# Patient Record
Sex: Female | Born: 1995 | Hispanic: Yes | Marital: Single | Smoking: Never smoker
Health system: Southern US, Community
[De-identification: ages and names within clinical notes are randomized; demographics above are authoritative.]

## PROBLEM LIST (undated history)

## (undated) ENCOUNTER — Emergency Department (HOSPITAL_COMMUNITY): Admission: EM | Payer: MEDICAID | Source: Home / Self Care

## (undated) DIAGNOSIS — N289 Disorder of kidney and ureter, unspecified: Secondary | ICD-10-CM

---
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
## 2014-03-17 ENCOUNTER — Encounter (HOSPITAL_COMMUNITY): Payer: Self-pay | Admitting: Emergency Medicine

## 2014-03-17 ENCOUNTER — Emergency Department (HOSPITAL_COMMUNITY)
Admission: EM | Admit: 2014-03-17 | Discharge: 2014-03-18 | Disposition: A | Discharge status: Home / Self Care | Payer: Medicaid Other | Attending: Emergency Medicine | Admitting: Emergency Medicine

## 2014-03-17 ENCOUNTER — Emergency Department (HOSPITAL_COMMUNITY): Payer: Medicaid Other

## 2014-03-17 DIAGNOSIS — Z3202 Encounter for pregnancy test, result negative: Secondary | ICD-10-CM | POA: Insufficient documentation

## 2014-03-17 DIAGNOSIS — N1330 Unspecified hydronephrosis: Secondary | ICD-10-CM | POA: Insufficient documentation

## 2014-03-17 HISTORY — DX: Disorder of kidney and ureter, unspecified: N28.9

## 2014-03-17 LAB — URINALYSIS, ROUTINE W REFLEX MICROSCOPIC
BILIRUBIN URINE: NEGATIVE
GLUCOSE, UA: NEGATIVE mg/dL
HGB URINE DIPSTICK: NEGATIVE
Ketones, ur: NEGATIVE mg/dL
Leukocytes, UA: NEGATIVE
NITRITE: NEGATIVE
PH: 6 (ref 5.0–8.0)
Protein, ur: NEGATIVE mg/dL
SPECIFIC GRAVITY, URINE: 1.018 (ref 1.005–1.030)
Urobilinogen, UA: 1 mg/dL (ref 0.0–1.0)

## 2014-03-17 LAB — COMPREHENSIVE METABOLIC PANEL
ALBUMIN: 4.3 g/dL (ref 3.5–5.2)
ALK PHOS: 97 U/L (ref 39–117)
ALT: 11 U/L (ref 0–35)
AST: 14 U/L (ref 0–37)
BUN: 8 mg/dL (ref 6–23)
CALCIUM: 9.5 mg/dL (ref 8.4–10.5)
CO2: 25 mEq/L (ref 19–32)
Chloride: 105 mEq/L (ref 96–112)
Creatinine, Ser: 0.65 mg/dL (ref 0.50–1.10)
GFR calc Af Amer: >90 mL/min (ref >90)
GFR calc non Af Amer: >90 mL/min (ref >90)
Glucose, Bld: 100 mg/dL — ABNORMAL HIGH (ref 70–99)
POTASSIUM: 4.3 meq/L (ref 3.7–5.3)
SODIUM: 142 meq/L (ref 137–147)
TOTAL PROTEIN: 7.5 g/dL (ref 6.0–8.3)
Total Bilirubin: 0.3 mg/dL (ref 0.3–1.2)

## 2014-03-17 LAB — CBC
HCT: 39.1 % (ref 36.0–46.0)
Hemoglobin: 13.9 g/dL (ref 12.0–15.0)
MCH: 31.4 pg (ref 26.0–34.0)
MCHC: 35.5 g/dL (ref 30.0–36.0)
MCV: 88.3 fL (ref 78.0–100.0)
PLATELETS: 234 10*3/uL (ref 150–400)
RBC: 4.43 MIL/uL (ref 3.87–5.11)
RDW: 14 % (ref 11.5–15.5)
WBC: 10.8 10*3/uL — ABNORMAL HIGH (ref 4.0–10.5)

## 2014-03-17 LAB — POC URINE PREG, ED: PREG TEST UR: NEGATIVE

## 2014-03-17 MED ORDER — IBUPROFEN 400 MG PO TABS
600.0000 mg | ORAL_TABLET | Freq: Once | ORAL | Status: AC
  Administered 2014-03-17: 600 mg via ORAL
  Filled 2014-03-17 (×2): qty 1

## 2014-03-17 NOTE — ED Notes (Signed)
Pt with hx of hydronephrosis to ED c/o acute onset L flank pain while sitting in class.  Pain brought on wave of dizziness.  Pt also c/o decreased urination x 1 week.

## 2014-03-18 MED ORDER — HYDROCODONE-ACETAMINOPHEN 5-325 MG PO TABS: 1.0000 | ORAL_TABLET | Freq: Four times a day (QID) | ORAL | Status: DC | PRN

## 2014-03-18 MED ORDER — IBUPROFEN 400 MG PO TABS: 400.0000 mg | ORAL_TABLET | Freq: Four times a day (QID) | ORAL | Status: DC | PRN

## 2014-03-18 NOTE — Discharge Instructions (Signed)
Please call and make an appointment with [facility] - or [facility] ([facility] You have a mild hydronephrosis, which is unchanged from before. Take meds as prescribed.  Return to the ER if there pain is unbearable.   Hydronephrosis Hydronephrosis is an abnormal enlargement of your kidney. It can affect one or both the kidneys. It results from the backward pressure of urine on the kidneys, when the flow of urine is blocked. Normally, the urine drains from the kidney through the urine tube (ureter), into a sac which holds the urine until urination (bladder). When the urinary flow is blocked, the urine collects above the block. This causes an increase in the pressure inside the kidney, which in turn leads to its enlargement. The block can occur at the point where the kidney joins the ureter. Treatment depends on the cause and location of the block.  CAUSES  The causes of this condition include:  Birth defect of the kidney or ureter.  Kink at the point where the kidney joins the ureter.  Stones and blood clots in the kidney or ureter.  Cancer, injury, or infection of the ureter.  Scar tissue formation.  Backflow of urine (reflux).  Cancer of bladder or prostate gland.  Abnormality of the nerves or muscles of the kidney or ureter.  Lower part of the ureter protruding into the bladder (ureterocele).  Abnormal contractions of the bladder.  Both the kidneys can be affected during pregnancy. This is because the enlarging uterus presses on the ureters and blocks the flow of urine. SYMPTOMS  The symptoms depend on the location of the block. They also depend on how long the block has been present. You may feel pain on the affected side. Sometimes, you may not have any symptoms. There may be a dull ache or discomfort in the flank. The common symptoms are:  Flank pain.  Swelling of the abdomen.  Pain in the abdomen.  Nausea and vomiting.  Fever.  Pain while passing  urine.  Urgency for urination.  Frequent or urgent urination.  Infection of the urinary tract. DIAGNOSIS  Your caregiver will examine you after asking about your symptoms. You may be asked to do blood and urine tests. Your caregiver may order a special X-ray, ultrasound, or CT scan. Sometimes a rigid or flexible telescope (cystoscope) is used to view the site of the blockage.  TREATMENT  Treatment depends on the site, cause, and duration of the block. The goal of treatment is to remove the blockage. Your caregiver will plan the treatment based on your condition. The different types of treatment are:   Putting in a soft plastic tube (ureteral stent) to connect the bladder with the kidney. This will help in draining the urine.  Putting in a soft tube (nephrostomy tube). This is placed through skin into the kidney. The trapped urine is drained out through the back. A plastic bag is attached to your skin to hold the urine that has drained out.  Antibiotics to treat or prevent infection.  Breaking down of the stone (lithotripsy). HOME CARE INSTRUCTIONS   It may take some time for the hydronephrosis to go away (resolve). Drink fluids as directed by your caregiver , and get a lot of rest.  If you have a drain in, your caregiver will give you directions about how to care for it. Be sure you understand these directions completely before you go home.  Take any antibiotics, pain medications, or other prescriptions exactly as prescribed.  Follow-up with  your caregivers as directed. SEEK MEDICAL CARE IF:   You continue to have flank pain, nausea, or difficulty with urination.  You have any problem with any type of drainage device.  Your urine becomes cloudy or bloody. SEEK IMMEDIATE MEDICAL CARE IF:   You have severe flank and/or abdominal pain.  You develop vomiting and are unable to hold down fluids.  You develop a fever above 100.5 F (38.1 C), or as per your caregiver. MAKE SURE  YOU:   Understand these instructions.  Will watch your condition.  Will get help right away if you are not doing well or get worse. Document Released: 10/09/2007 Document Revised: 03/05/2012 Document Reviewed: 11/25/2010 [facility] LLC[facility] Patient Information 2014 [place], [place].

## 2014-03-18 NOTE — ED Provider Notes (Signed)
CSN: [id number]     Arrival date & time 03/17/14  1656 History   First MD Initiated Contact with Patient 03/17/14 2145     Chief Complaint  Patient presents with  . 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 States that she was sitting in class round 2pm today and began feeling a sharp, stabbing pain in the left flank without radiation. She felt as if this pain was similar to her episode of severe hydronephrosis two years prior at which time her UPJ obstruction was diagnosed. The pain has been constant and not relapsing/remitting in nature.  Patient denies nausea, vomiting, dysuria, polyuria, hematuria, and diarrhea. No fevers or chills.   Patient is a 18 y.o. female presenting with back pain. The history is provided by the patient.  Back Pain Associated symptoms: no abdominal pain, no chest pain, no dysuria and no headaches     Past Medical History  Diagnosis Date  . Renal disorder     hydronephrosis L   Past Surgical History  Procedure Laterality Date  . Kidney surgery Left 2013    with stent placement and removal   No family history on file. History  Substance Use Topics  . Smoking status: Never Smoker   . Smokeless tobacco: Not on file  . Alcohol Use: No   OB History   Grav Para Term Preterm Abortions TAB SAB Ect Mult Living                 Review of Systems  Constitutional: Negative for activity change.  Respiratory: Negative for shortness of breath.   Cardiovascular: Negative for chest pain.  Gastrointestinal: Negative for nausea, vomiting and abdominal pain.  Genitourinary: Positive for flank pain. Negative for dysuria.  Musculoskeletal: Positive for back pain. Negative for neck pain.  Neurological: Negative for headaches.       Allergies  Review of patient's allergies indicates no known allergies.  Home Medications  No current outpatient prescriptions on file. BP 124/78  Pulse 89  Temp(Src) 98.1 F (36.7 C) (Oral)  Resp 16  Ht 5\' 3"  (1.6 m)  Wt 138 lb (62.596 kg)  BMI 24.45 kg/m2  SpO2 100%  LMP 02/24/2014 Physical Exam  Nursing note and vitals reviewed. Constitutional: She is oriented to person, place, and time. She appears well-developed and well-nourished.  HENT:  Head: Normocephalic and atraumatic.  Eyes: EOM are normal. Pupils are equal, round, and reactive to light.  Neck: Neck supple.  Cardiovascular: Normal rate, regular rhythm and normal heart sounds.   No murmur heard. Pulmonary/Chest: Effort normal. No respiratory distress.  Abdominal: Soft. She exhibits no distension. There is no tenderness. There is no rebound and no guarding.  Neurological: She is alert and oriented to person, place, and time.  Skin: Skin is warm and dry.    ED Course  Procedures (including critical care time) Labs Review Labs Reviewed  CBC - Abnormal; Notable for the following:    WBC 10.8 (*)    All other components within normal limits  COMPREHENSIVE METABOLIC PANEL - Abnormal; Notable for the following:    Glucose, Bld 100 (*)    All other components within normal limits  URINALYSIS, ROUTINE W REFLEX MICROSCOPIC  POC URINE PREG, ED   Imaging Review No results found.   EKG Interpretation None      MDM  Final diagnoses:  None    Pt comes in w/ back pain. Has UPJ obstruction and hx of requiring stents due to some congenital problems. Pt has similar abd pain - no other concerning symptoms, and no spine tenderness on exam. US ordered - and shows no hydro - so we will have her follow up with [facility] - where she has been getting care.   Derwood KaplanAnkit Buford Bremer, MD 03/18/14 [phone]

## 2014-10-12 ENCOUNTER — Encounter (HOSPITAL_COMMUNITY): Payer: Self-pay | Admitting: Emergency Medicine

## 2014-10-12 DIAGNOSIS — R110 Nausea: Secondary | ICD-10-CM | POA: Insufficient documentation

## 2014-10-12 DIAGNOSIS — N132 Hydronephrosis with renal and ureteral calculous obstruction: Secondary | ICD-10-CM | POA: Diagnosis not present

## 2014-10-12 DIAGNOSIS — M549 Dorsalgia, unspecified: Secondary | ICD-10-CM | POA: Insufficient documentation

## 2014-10-12 DIAGNOSIS — Z79899 Other long term (current) drug therapy: Secondary | ICD-10-CM | POA: Diagnosis not present

## 2014-10-12 DIAGNOSIS — R109 Unspecified abdominal pain: Secondary | ICD-10-CM | POA: Diagnosis present

## 2014-10-12 LAB — COMPREHENSIVE METABOLIC PANEL
ALT: 13 U/L (ref 0–35)
AST: 15 U/L (ref 0–37)
Albumin: 3.8 g/dL (ref 3.5–5.2)
Alkaline Phosphatase: 78 U/L (ref 39–117)
Anion gap: 11 (ref 5–15)
BUN: 9 mg/dL (ref 6–23)
CALCIUM: 9.2 mg/dL (ref 8.4–10.5)
CO2: 24 meq/L (ref 19–32)
Chloride: 102 mEq/L (ref 96–112)
Creatinine, Ser: 0.8 mg/dL (ref 0.50–1.10)
GLUCOSE: 88 mg/dL (ref 70–99)
Potassium: 4.1 mEq/L (ref 3.7–5.3)
SODIUM: 137 meq/L (ref 137–147)
Total Bilirubin: 0.3 mg/dL (ref 0.3–1.2)
Total Protein: 7.2 g/dL (ref 6.0–8.3)

## 2014-10-12 LAB — URINALYSIS, ROUTINE W REFLEX MICROSCOPIC
BILIRUBIN URINE: NEGATIVE
Glucose, UA: NEGATIVE mg/dL
Hgb urine dipstick: NEGATIVE
KETONES UR: NEGATIVE mg/dL
Nitrite: NEGATIVE
Protein, ur: NEGATIVE mg/dL
SPECIFIC GRAVITY, URINE: 1.018 (ref 1.005–1.030)
UROBILINOGEN UA: 1 mg/dL (ref 0.0–1.0)
pH: 7 (ref 5.0–8.0)

## 2014-10-12 LAB — CBC
HEMATOCRIT: 39.7 % (ref 36.0–46.0)
HEMOGLOBIN: 13.9 g/dL (ref 12.0–15.0)
MCH: 30.8 pg (ref 26.0–34.0)
MCHC: 35 g/dL (ref 30.0–36.0)
MCV: 87.8 fL (ref 78.0–100.0)
Platelets: 213 10*3/uL (ref 150–400)
RBC: 4.52 MIL/uL (ref 3.87–5.11)
RDW: 13.9 % (ref 11.5–15.5)
WBC: 9.1 10*3/uL (ref 4.0–10.5)

## 2014-10-12 LAB — URINE MICROSCOPIC-ADD ON

## 2014-10-12 NOTE — ED Notes (Signed)
Pt c/o left side back pain for a month. Pt reports increase weakness and weight loss due to pain. Pt reports 25 lbs weight loss in the past three months. Pt also reports dizziness and a decrease in urinary output.

## 2014-10-13 ENCOUNTER — Emergency Department (HOSPITAL_COMMUNITY)
Admission: EM | Admit: 2014-10-13 | Discharge: 2014-10-13 | Disposition: A | Discharge status: Home / Self Care | Payer: Medicaid Other | Attending: Emergency Medicine | Admitting: Emergency Medicine

## 2014-10-13 ENCOUNTER — Emergency Department (HOSPITAL_COMMUNITY): Payer: Medicaid Other

## 2014-10-13 DIAGNOSIS — N1330 Unspecified hydronephrosis: Secondary | ICD-10-CM

## 2014-10-13 MED ORDER — TRAMADOL HCL 50 MG PO TABS: 50.0000 mg | ORAL_TABLET | Freq: Four times a day (QID) | ORAL | Status: DC | PRN

## 2014-10-13 MED ORDER — IBUPROFEN 600 MG PO TABS: 600.0000 mg | ORAL_TABLET | Freq: Four times a day (QID) | ORAL | Status: DC | PRN

## 2014-10-13 NOTE — ED Notes (Addendum)
Pt here due to upper left back pain that started over a month ago. Pt reports pain is intermittent, no pain at this time. Pt also reports surgery to area 2-7028yrs ago due to hydronephrosis, had stents placed and has not had issues since. Pt states this pain is similar to that. Pt ambulatory to room. Pt also reports decreased urine output for fluid intake. Dr. Ranae PalmsYelverton at bedside.

## 2014-10-13 NOTE — Discharge Instructions (Signed)
Hydronephrosis  Hydronephrosis is an abnormal enlargement of your kidney. It can affect one or both the kidneys. It results from the backward pressure of urine on the kidneys, when the flow of urine is blocked. Normally, the urine drains from the kidney through the urine tube (ureter), into a sac which holds the urine until urination (bladder). When the urinary flow is blocked, the urine collects above the block. This causes an increase in the pressure inside the kidney, which in turn leads to its enlargement. The block can occur at the point where the kidney joins the ureter. Treatment depends on the cause and location of the block.   CAUSES   The causes of this condition include:  · Birth defect of the kidney or ureter.  · Kink at the point where the kidney joins the ureter.  · Stones and blood clots in the kidney or ureter.  · Cancer, injury, or infection of the ureter.  · Scar tissue formation.  · Backflow of urine (reflux).  · Cancer of bladder or prostate gland.  · Abnormality of the nerves or muscles of the kidney or ureter.  · Lower part of the ureter protruding into the bladder (ureterocele).  · Abnormal contractions of the bladder.  · Both the kidneys can be affected during pregnancy. This is because the enlarging uterus presses on the ureters and blocks the flow of urine.  SYMPTOMS   The symptoms depend on the location of the block. They also depend on how long the block has been present. You may feel pain on the affected side. Sometimes, you may not have any symptoms. There may be a dull ache or discomfort in the flank. The common symptoms are:  · Flank pain.  · Swelling of the abdomen.  · Pain in the abdomen.  · Nausea and vomiting.  · Fever.  · Pain while passing urine.  · Urgency for urination.  · Frequent or urgent urination.  · Infection of the urinary tract.  DIAGNOSIS   Your caregiver will examine you after asking about your symptoms. You may be asked to do blood and urine tests. Your caregiver  may order a special X-ray, ultrasound, or CT scan. Sometimes a rigid or flexible telescope (cystoscope) is used to view the site of the blockage.   TREATMENT   Treatment depends on the site, cause, and duration of the block. The goal of treatment is to remove the blockage. Your caregiver will plan the treatment based on your condition. The different types of treatment are:   · Putting in a soft plastic tube (ureteral stent) to connect the bladder with the kidney. This will help in draining the urine.  · Putting in a soft tube (nephrostomy tube). This is placed through skin into the kidney. The trapped urine is drained out through the back. A plastic bag is attached to your skin to hold the urine that has drained out.  · Antibiotics to treat or prevent infection.  · Breaking down of the stone (lithotripsy).  HOME CARE INSTRUCTIONS   · It may take some time for the hydronephrosis to go away (resolve). Drink fluids as directed by your caregiver , and get a lot of rest.  · If you have a drain in, your caregiver will give you directions about how to care for it. Be sure you understand these directions completely before you go home.  · Take any antibiotics, pain medications, or other prescriptions exactly as prescribed.  · Follow-up with your caregivers   as directed.  SEEK MEDICAL CARE IF:   · You continue to have flank pain, nausea, or difficulty with urination.  · You have any problem with any type of drainage device.  · Your urine becomes cloudy or bloody.  SEEK IMMEDIATE MEDICAL CARE IF:   · You have severe flank and/or abdominal pain.  · You develop vomiting and are unable to hold down fluids.  · You develop a fever above 100.5° F (38.1° C), or as per your caregiver.  MAKE SURE YOU:   · Understand these instructions.  · Will watch your condition.  · Will get help right away if you are not doing well or get worse.  Document Released: 10/09/2007 Document Revised: 03/05/2012 Document Reviewed: 11/25/2010  ExitCare®  Patient Information ©2015 [employer], LLC. This information is not intended to replace advice given to you by your health care provider. Make sure you discuss any questions you have with your health care provider.

## 2014-10-13 NOTE — ED Provider Notes (Signed)
CSN: [id number]     Arrival date & time 10/12/14  2033 History   First MD Initiated Contact with Patient 10/13/14 0113     Chief Complaint  Patient presents with  . Back Pain     (Consider location/radiation/quality/duration/timing/severity/associated sxs/prior Treatment) HPI Patient present with left flank pain that does not radiate for the past month. The pain is episodic. Patient denies any dysuria, hematuria or frequency. No fever or chills. Mild nausea with pain but no vomiting. Patient states the pain was persistent for 5 hours this evening but is now resolved. She has a history of prior left-sided hydronephrosis requiring stent placement. This was done 2 years ago. She states pain is similar to previous flank pain associated with hydronephrosis. Denies abdominal pain. Has had no vaginal bleeding or discharge. No diarrhea or constipation. Past Medical History  Diagnosis Date  . Renal disorder     hydronephrosis L   Past Surgical History  Procedure Laterality Date  . Kidney surgery Left 2013    with stent placement and removal   History reviewed. No pertinent family history. History  Substance Use Topics  . Smoking status: Never Smoker   . Smokeless tobacco: Not on file  . Alcohol Use: No   OB History   Grav Para Term Preterm Abortions TAB SAB Ect Mult Living                 Review of Systems  Constitutional: Negative for fever and chills.  Respiratory: Negative for cough and shortness of breath.   Cardiovascular: Negative for chest pain.  Gastrointestinal: Positive for nausea. Negative for vomiting, abdominal pain, diarrhea and constipation.  Genitourinary: Positive for flank pain. Negative for dysuria, frequency, vaginal bleeding, vaginal discharge, difficulty urinating and pelvic pain.  Musculoskeletal: Positive for back pain. Negative for myalgias, neck pain and neck stiffness.  Skin: Negative for rash and wound.  Neurological: Negative for dizziness, weakness,  light-headedness, numbness and headaches.  All other systems reviewed and are negative.     Allergies  Review of patient's allergies indicates no known allergies.  Home Medications   Prior to Admission medications   Medication Sig Start Date End Date Taking? Authorizing Provider  levonorgestrel-ethinyl estradiol (CHATEAL) 0.15-30 MG-MCG tablet Take 1 tablet by mouth daily.   Yes Historical Provider, MD   BP 102/74  Pulse 69  Temp(Src) 98.1 F (36.7 C) (Oral)  Resp 16  Ht 5\' 3"  (1.6 m)  Wt 125 lb (56.7 kg)  BMI 22.15 kg/m2  SpO2 100% Physical Exam  Nursing note and vitals reviewed. Constitutional: She is oriented to person, place, and time. She appears well-developed and well-nourished. No distress.  HENT:  Head: Normocephalic and atraumatic.  Mouth/Throat: Oropharynx is clear and moist.  Eyes: EOM are normal. Pupils are equal, round, and reactive to light.  Neck: Normal range of motion. Neck supple.  Cardiovascular: Normal rate and regular rhythm.   Pulmonary/Chest: Effort normal and breath sounds normal. No respiratory distress. She has no wheezes. She has no rales. She exhibits no tenderness.  Abdominal: Soft. Bowel sounds are normal. She exhibits no distension and no mass. There is no tenderness. There is no rebound and no guarding.  Musculoskeletal: Normal range of motion. She exhibits no edema and no tenderness.  No CVA tenderness bilaterally.  Neurological: She is alert and oriented to person, place, and time.  Moves all extremities without deficit. Sensation is grossly intact.  Skin: Skin is warm and dry. No rash noted. No erythema.  Psychiatric:  She has a normal mood and affect. Her behavior is normal.    ED Course  Procedures (including critical care time) Labs Review Labs Reviewed  URINALYSIS, ROUTINE W REFLEX MICROSCOPIC - Abnormal; Notable for the following:    Leukocytes, UA SMALL (*)    All other components within normal limits  URINE MICROSCOPIC-ADD ON  - Abnormal; Notable for the following:    Squamous Epithelial / LPF FEW (*)    All other components within normal limits  CBC  COMPREHENSIVE METABOLIC PANEL    Imaging Review No results found.   EKG Interpretation None      MDM   Final diagnoses:  None   Patient's pain is improved. Ultrasound of the left kidney with mild hydronephrosis. Ureteral jets bilaterally apparent. Advised to followup with urology. Return precautions given.     Loren Raceravid Davari Lopes, MD 10/13/14 [phone]

## 2014-10-13 NOTE — ED Notes (Signed)
Pt verbalizes understanding of d/c instructions and directions for follow-up care. Pt verbalizes no additional concerns and all questions answered.

## 2016-05-29 ENCOUNTER — Emergency Department (HOSPITAL_COMMUNITY)
Admission: EM | Admit: 2016-05-29 | Discharge: 2016-05-29 | Disposition: A | Discharge status: Home / Self Care | Payer: Self-pay | Attending: Emergency Medicine | Admitting: Emergency Medicine

## 2016-05-29 ENCOUNTER — Emergency Department (HOSPITAL_COMMUNITY): Payer: Self-pay

## 2016-05-29 ENCOUNTER — Encounter (HOSPITAL_COMMUNITY): Payer: Self-pay | Admitting: Nurse Practitioner

## 2016-05-29 DIAGNOSIS — R109 Unspecified abdominal pain: Secondary | ICD-10-CM

## 2016-05-29 DIAGNOSIS — R1032 Left lower quadrant pain: Secondary | ICD-10-CM | POA: Insufficient documentation

## 2016-05-29 NOTE — ED Notes (Signed)
She c/o 2 week history L lower back pain. She was unable to sleep last night due to pain. She denies n/v, bowel/bladder changes, fevers. She reports palpable knots under her skin of abd that are painful x 1 month.

## 2016-05-29 NOTE — ED Notes (Addendum)
States feels "lumps" in right lower abd - states causing burning pain. States have been there "for a while but causing me pain". States has been taking Ibuprofen 400mg  for pain w/min relief. States last was at 0800 this am.

## 2016-05-29 NOTE — ED Provider Notes (Signed)
CSN: [id number]     Arrival date & time 05/29/16  1754 History  By signing my name below, [id number], Phillis HaggisGabriella Gaje, attest that this documentation has been prepared under the direction and in the presence of Sealed Air CorporationHeather Maizee Reinhold, PA-C. Electronically Signed: Phillis HaggisGabriella Gaje, ED Scribe. 05/29/2016. 9:07 PM.  Chief Complaint  Patient presents with  . Back Pain  . Skin Problem   The history is provided by the patient. No language interpreter was used.  HPI Comments: Sherian Reinda Pratt is a 20 y.o. 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 She states that it feels similar to her past hydronephrosis pain. She also reports right lower abdominal cramping that began ~3 weeks ago. Pt began the depo shot 4 months ago. Pt's last dosage was earlier in May. She reports that there are "lumps that feel like little balls" under her skin that causes stabbing, burning pain. She does not currently feel them, but has felt them in the past. Pt is not concerned for STDs; she was last tested in February and has been monogamous with the same partner. She denies injury to the back, fever, nausea, vomiting, bladder or bowel incontinence, hematuria, vaginal bleeding, vaginal discharge, or fever. LMP 05/10/16.   Past Medical History  Diagnosis Date  . Renal disorder     hydronephrosis L   Past Surgical History  Procedure Laterality Date  . Kidney surgery Left 2013    with stent placement and removal   History reviewed. No pertinent family history. Social History  Substance Use Topics  . Smoking status: Never Smoker   . Smokeless tobacco: None  . Alcohol Use: No   OB History    No data available     Review of Systems A complete 10 system review of systems was obtained and all systems are negative except as noted in the HPI and PMH.   Allergies  Review of patient's  allergies indicates no known allergies.  Home Medications   Prior to Admission medications   Medication Sig Start Date End Date Taking? Authorizing Provider  ibuprofen (ADVIL,MOTRIN) 600 MG tablet Take 1 tablet (600 mg total) by mouth every 6 (six) hours as needed. 10/13/14   Loren Raceravid Yelverton, MD  levonorgestrel-ethinyl estradiol (CHATEAL) 0.15-30 MG-MCG tablet Take 1 tablet by mouth daily.    Historical Provider, MD  traMADol (ULTRAM) 50 MG tablet Take 1 tablet (50 mg total) by mouth every 6 (six) hours as needed for moderate pain. 10/13/14   Loren Raceravid Yelverton, MD   BP 133/81 mmHg  Pulse 87  Temp(Src) 98.3 F (36.8 C) (Oral)  Resp 17  SpO2 100% Physical Exam  Constitutional: She is oriented to person, place, and time. She appears well-developed and well-nourished.  HENT:  Head: Normocephalic and atraumatic.  Mouth/Throat: Oropharynx is clear and moist.  Eyes: Conjunctivae and EOM are normal. Pupils are equal, round, and reactive to light.  Neck: Normal range of motion. Neck supple.  Cardiovascular: Normal rate and regular rhythm.   Pulmonary/Chest: Effort normal and breath sounds normal.  Abdominal: Soft. Bowel sounds are normal. She exhibits no distension and no mass. There is no tenderness.  No tenderness to palpation of abdomen  Musculoskeletal: Normal range of motion.  Neurological: She is alert and oriented to person, place, and time.  Skin: Skin is warm and dry.  Psychiatric: She has a normal mood and affect.  Her behavior is normal.  Nursing note and vitals reviewed.   ED Course  Procedures (including critical care time) DIAGNOSTIC STUDIES: Oxygen Saturation is 100% on RA, normal by my interpretation.    COORDINATION OF CARE: 7:50 PM-Discussed treatment plan which includes [place] with pt at bedside and pt agreed to plan.    Labs Review Labs Reviewed - No data to display  Imaging Review US Renal  05/29/2016  CLINICAL DATA:  Left flank pain.  History of hydronephrosis.  EXAM: RENAL / URINARY TRACT ULTRASOUND COMPLETE COMPARISON:  October 13, 2014 FINDINGS: Right Kidney: Length: 11 cm. Echogenicity within normal limits. No mass or hydronephrosis visualized. Left Kidney: Length: 10.5 cm. There is mild prominence of the left renal pelvis which is actually decreased since the comparison study. Bladder: Appears normal for degree of bladder distention. IMPRESSION: Mild prominence of left renal pelvis, decreased since October 2015. No other acute abnormalities. Electronically Signed   By: Gerome Sam III M.D   On: 05/29/2016 21:01   I have personally reviewed and evaluated these images and lab results as part of my medical decision-making.   EKG Interpretation None      MDM    Final diagnoses:  None   Patient with a history of Hydronephrosis presents today with left flank pain. She reports that the pain feels similar to the pain she had with Hydronephrosis in the past.  Ultrasound results as above showing mild prominence of left renal pelvis, decreased since October 2015.  No masses of the abdomen palpated.  Feel that the patient is stable for discharge.  Return precautions given.    I personally performed the services described in this documentation, which was scribed in my presence. The recorded information has been reviewed and is accurate.    Santiago Glad, PA-C 05/30/16 1610  Rolland Porter, MD 06/07/16 [phone]

## 2016-05-29 NOTE — ED Notes (Signed)
Megan, X-Ray Tech, advised U/S Tech in w/pt and will advise of this pt's test when finishes.

## 2017-04-15 ENCOUNTER — Emergency Department (HOSPITAL_COMMUNITY)
Admission: EM | Admit: 2017-04-15 | Discharge: 2017-04-16 | Disposition: A | Discharge status: Home / Self Care | Payer: Medicaid Other | Attending: Emergency Medicine | Admitting: Emergency Medicine

## 2017-04-15 ENCOUNTER — Encounter (HOSPITAL_COMMUNITY): Payer: Self-pay | Admitting: Emergency Medicine

## 2017-04-15 DIAGNOSIS — N939 Abnormal uterine and vaginal bleeding, unspecified: Secondary | ICD-10-CM | POA: Insufficient documentation

## 2017-04-15 LAB — COMPREHENSIVE METABOLIC PANEL
ALK PHOS: 97 U/L (ref 38–126)
ALT: 28 U/L (ref 14–54)
AST: 25 U/L (ref 15–41)
Albumin: 4 g/dL (ref 3.5–5.0)
Anion gap: 8 (ref 5–15)
BILIRUBIN TOTAL: 0.5 mg/dL (ref 0.3–1.2)
BUN: 7 mg/dL (ref 6–20)
CALCIUM: 9.2 mg/dL (ref 8.9–10.3)
CO2: 25 mmol/L (ref 22–32)
Chloride: 107 mmol/L (ref 101–111)
Creatinine, Ser: 0.67 mg/dL (ref 0.44–1.00)
GFR calc Af Amer: >60 mL/min (ref >60)
GFR calc non Af Amer: >60 mL/min (ref >60)
Glucose, Bld: 98 mg/dL (ref 65–99)
POTASSIUM: 4 mmol/L (ref 3.5–5.1)
Sodium: 140 mmol/L (ref 135–145)
TOTAL PROTEIN: 6.4 g/dL — AB (ref 6.5–8.1)

## 2017-04-15 LAB — URINALYSIS, ROUTINE W REFLEX MICROSCOPIC
Bacteria, UA: NONE SEEN
Bilirubin Urine: NEGATIVE
Glucose, UA: NEGATIVE mg/dL
KETONES UR: NEGATIVE mg/dL
LEUKOCYTES UA: NEGATIVE
NITRITE: NEGATIVE
PROTEIN: NEGATIVE mg/dL
Specific Gravity, Urine: 1.013 (ref 1.005–1.030)
pH: 8 (ref 5.0–8.0)

## 2017-04-15 LAB — POC URINE PREG, ED: Preg Test, Ur: NEGATIVE

## 2017-04-15 LAB — CBC
HCT: 41.5 % (ref 36.0–46.0)
HEMOGLOBIN: 14.2 g/dL (ref 12.0–15.0)
MCH: 30.5 pg (ref 26.0–34.0)
MCHC: 34.2 g/dL (ref 30.0–36.0)
MCV: 89.1 fL (ref 78.0–100.0)
Platelets: 193 10*3/uL (ref 150–400)
RBC: 4.66 MIL/uL (ref 3.87–5.11)
RDW: 13.5 % (ref 11.5–15.5)
WBC: 9.6 10*3/uL (ref 4.0–10.5)

## 2017-04-15 NOTE — ED Triage Notes (Signed)
Pt presents to ED for assessment of vaginal bleeding between periods.  Pt sts she stopped birth control 2 months ago, and since has had very heavy gushes of blood between periods with abdominal cramping.  Pt also c/o lower back pain.  Denies any n/v/d.  Pt does c/o some feelings of weakness and fatigue.

## 2017-04-16 LAB — WET PREP, GENITAL
Clue Cells Wet Prep HPF POC: NONE SEEN
Sperm: NONE SEEN
Trich, Wet Prep: NONE SEEN
YEAST WET PREP: NONE SEEN

## 2017-04-16 NOTE — ED Provider Notes (Signed)
[facility]-EMERGENCY DEPT Provider Note   CSN: [id number] Arrival date & time: 04/15/17  2058     History   Chief Complaint Chief Complaint  Patient presents with  . Vaginal Bleeding    HPI Renee Ferrell is a 21 y.o. female.  This a 21 year old female who stopped using her birth control pills 2 months ago.  Since that time she's had intermittent episodes of breakthrough bleeding.  She says when she wakes up in the morning.  She's fine, but several hours later she will have a gush of blood and have to wear a pad as happens 2 or 3 times a day.  It's been happening for the past 5 days.  Also reports mild cramping. She also states that she's feeling weak.  Has not followed up with her OB/GYN. Denies any vaginal discharge or concerns for an STD Reason.  She stopped using birth control was because she wanted a medication break.      Past Medical History:  Diagnosis Date  . Renal disorder    hydronephrosis L    There are no active problems to display for this patient.   Past Surgical History:  Procedure Laterality Date  . KIDNEY SURGERY Left 2013   with stent placement and removal    OB History    No data available       Home Medications    Prior to Admission medications   Medication Sig Start Date End Date Taking? Authorizing Provider  ibuprofen (ADVIL,MOTRIN) 600 MG tablet Take 1 tablet (600 mg total) by mouth every 6 (six) hours as needed. Patient not taking: Reported on 04/16/2017 10/13/14   Loren Racer, MD  traMADol (ULTRAM) 50 MG tablet Take 1 tablet (50 mg total) by mouth every 6 (six) hours as needed for moderate pain. Patient not taking: Reported on 04/16/2017 10/13/14   Loren Racer, MD    Family History History reviewed. No pertinent family history.  Social History Social History  Substance Use Topics  . Smoking status: Never Smoker  . Smokeless tobacco: Never Used  . Alcohol use No     Allergies   Patient has no known allergies.   Review of  Systems Review of Systems  Constitutional: Negative for chills and fever.  Gastrointestinal: Positive for abdominal pain.  Genitourinary: Positive for vaginal bleeding. Negative for dysuria, pelvic pain, vaginal discharge and vaginal pain.  All other systems reviewed and are negative.    Physical Exam Updated Vital Signs BP 121/81 (BP Location: Left Arm)   Pulse 92   Temp 98.8 F (37.1 C) (Oral)   Resp 18   SpO2 100%   Physical Exam  Constitutional: She appears well-developed and well-nourished. No distress.  HENT:  Head: Normocephalic.  Eyes: Pupils are equal, round, and reactive to light.  Neck: Normal range of motion.  Cardiovascular: Normal rate.   Pulmonary/Chest: Effort normal.  Abdominal: Soft.  Musculoskeletal: Normal range of motion.  Neurological: She is alert.  Skin: Skin is warm.  Psychiatric: She has a normal mood and affect.  Nursing note and vitals reviewed.    ED Treatments / Results  Labs (all labs ordered are listed, but only abnormal results are displayed) Labs Reviewed  WET PREP, GENITAL - Abnormal; Notable for the following:       Result Value   WBC, Wet Prep HPF POC MANY (*)    All other components within normal limits  COMPREHENSIVE METABOLIC PANEL - Abnormal; Notable for the following:    Total Protein 6.4 (*)  All other components within normal limits  URINALYSIS, ROUTINE W REFLEX MICROSCOPIC - Abnormal; Notable for the following:    Hgb urine dipstick SMALL (*)    Squamous Epithelial / LPF 0-5 (*)    All other components within normal limits  CBC  POC URINE PREG, ED  GC/CHLAMYDIA PROBE AMP ([place]) NOT AT [facility]    EKG  EKG Interpretation None       Radiology No results found.  Procedures Procedures (including critical care time)  Medications Ordered in ED Medications - No data to display   Initial Impression / Assessment and Plan / ED Course  I have reviewed the triage vital signs and the nursing  notes.  Pertinent labs & imaging results that were available during my care of the patient were reviewed by me and considered in my medical decision making (see chart for details).      H&H are normal.  No abnormality on prep.  Patient has been encouraged to follow-up with her PCP to discuss going back on birth control versus waiting until her body just to withdrawal from hormones that she did taking for over a year. She is not anemic or pregnant  Final Clinical Impressions(s) / ED Diagnoses   Final diagnoses:  Abnormal uterine bleeding    New Prescriptions New Prescriptions   No medications on file     Earley Favor, NP 04/16/17 2595    Alvira Monday, MD 04/17/17 1212

## 2017-04-16 NOTE — ED Notes (Signed)
ED Provider at bedside. 

## 2017-04-16 NOTE — Discharge Instructions (Signed)
Tonight  you were evaluated for abnormal vaginal bleeding  you are not anemic, your pregnancy test is normal and have no signs of infection on your pelvic examination.  Make an appointment with your PCP to discuss going back on birth control or wait for your body to adjust to the changes that occur when you stop taking hormones

## 2017-04-16 NOTE — ED Notes (Signed)
Pt unable to sign due to signature pad not working. Pt verbalized understanding of d.c instrcutions and had no further questions. NAD VSS

## 2017-04-17 LAB — GC/CHLAMYDIA PROBE AMP (~~LOC~~) NOT AT ARMC
Chlamydia: NEGATIVE
Neisseria Gonorrhea: NEGATIVE

## 2018-03-12 ENCOUNTER — Other Ambulatory Visit: Payer: Self-pay

## 2018-03-12 ENCOUNTER — Encounter (HOSPITAL_COMMUNITY): Payer: Self-pay | Admitting: Emergency Medicine

## 2018-03-12 ENCOUNTER — Emergency Department (HOSPITAL_COMMUNITY): Payer: Self-pay

## 2018-03-12 DIAGNOSIS — R002 Palpitations: Secondary | ICD-10-CM | POA: Insufficient documentation

## 2018-03-12 LAB — BASIC METABOLIC PANEL
Anion gap: 8 (ref 5–15)
BUN: 7 mg/dL (ref 6–20)
CO2: 25 mmol/L (ref 22–32)
CREATININE: 0.72 mg/dL (ref 0.44–1.00)
Calcium: 9.1 mg/dL (ref 8.9–10.3)
Chloride: 105 mmol/L (ref 101–111)
GFR calc Af Amer: >60 mL/min (ref >60)
GFR calc non Af Amer: >60 mL/min (ref >60)
Glucose, Bld: 96 mg/dL (ref 65–99)
POTASSIUM: 4.1 mmol/L (ref 3.5–5.1)
Sodium: 138 mmol/L (ref 135–145)

## 2018-03-12 LAB — I-STAT BETA HCG BLOOD, ED (MC, WL, AP ONLY)

## 2018-03-12 LAB — I-STAT TROPONIN, ED: Troponin i, poc: 0 ng/mL (ref 0.00–0.08)

## 2018-03-12 LAB — CBC
HEMATOCRIT: 40.6 % (ref 36.0–46.0)
Hemoglobin: 14.2 g/dL (ref 12.0–15.0)
MCH: 32.2 pg (ref 26.0–34.0)
MCHC: 35 g/dL (ref 30.0–36.0)
MCV: 92.1 fL (ref 78.0–100.0)
PLATELETS: 227 10*3/uL (ref 150–400)
RBC: 4.41 MIL/uL (ref 3.87–5.11)
RDW: 13.3 % (ref 11.5–15.5)
WBC: 9.3 10*3/uL (ref 4.0–10.5)

## 2018-03-12 NOTE — ED Triage Notes (Signed)
Pt c/o intermittent "fluttering" and intermittent chest discomfort, central, tightness.  Minor shob associated with discomfort Pt denies new food or beverage usage  Denies drug usage

## 2018-03-13 ENCOUNTER — Emergency Department (HOSPITAL_COMMUNITY)
Admission: EM | Admit: 2018-03-13 | Discharge: 2018-03-13 | Disposition: A | Discharge status: Home / Self Care | Payer: Self-pay | Attending: Emergency Medicine | Admitting: Emergency Medicine

## 2018-03-13 DIAGNOSIS — R002 Palpitations: Secondary | ICD-10-CM

## 2018-03-13 LAB — TSH: TSH: 5.768 u[IU]/mL — ABNORMAL HIGH (ref 0.350–4.500)

## 2018-03-13 NOTE — Discharge Instructions (Signed)
Follow up with cardiology to discuss possible monitoring. Return to the emergency department with any chest pain, shortness of breath, worsening symptoms or new concerns.

## 2018-03-13 NOTE — ED Provider Notes (Signed)
[facility] EMERGENCY DEPARTMENT Provider Note   CSN: [id number] Arrival date & time: 03/12/18  2225     History   Chief Complaint Chief Complaint  Patient presents with  . Palpitations    HPI Renee Ferrell is a 22 y.o. female.  Patient presents for evaluation of episodes of palpitations described as skipping beats. No chest pain or SOB. Symptoms started x 1 day. She states her chest feels tight when she has skipping beats but denies that it is uncomfortable. No nausea, vomiting. No history of the same. She is a nonsmoker, otherwise healthy person who denies substance abuse. No OTC supplements. She reports she seldomly drinks caffeine. No significant weight loss or gain recently.   The history is provided by the patient. No language interpreter was used.  Palpitations   Pertinent negatives include no fever, no chest pain, no abdominal pain, no nausea, no vomiting, no weakness and no shortness of breath.    Past Medical History:  Diagnosis Date  . Renal disorder    hydronephrosis L    There are no active problems to display for this patient.   Past Surgical History:  Procedure Laterality Date  . KIDNEY SURGERY Left 2013   with stent placement and removal    OB History    No data available       Home Medications    Prior to Admission medications   Medication Sig Start Date End Date Taking? Authorizing Provider  ibuprofen (ADVIL,MOTRIN) 600 MG tablet Take 1 tablet (600 mg total) by mouth every 6 (six) hours as needed. Patient not taking: Reported on 04/16/2017 10/13/14   Loren Racer, MD  traMADol (ULTRAM) 50 MG tablet Take 1 tablet (50 mg total) by mouth every 6 (six) hours as needed for moderate pain. Patient not taking: Reported on 04/16/2017 10/13/14   Loren Racer, MD    Family History No family history on file.  Social History Social History   Tobacco Use  . Smoking status: Never Smoker  . Smokeless tobacco: Never Used  Substance  Use Topics  . Alcohol use: Yes    Comment: socially  . Drug use: No     Allergies   Patient has no known allergies.   Review of Systems Review of Systems  Constitutional: Negative for chills and fever.  HENT: Negative.   Respiratory: Negative.  Negative for shortness of breath.   Cardiovascular: Positive for palpitations. Negative for chest pain.  Gastrointestinal: Negative.  Negative for abdominal pain, nausea and vomiting.  Musculoskeletal: Negative.   Skin: Negative.   Neurological: Negative.  Negative for weakness.     Physical Exam Updated Vital Signs BP 121/76   Pulse 76   Temp 98.5 F (36.9 C)   Resp 18   Ht 5\' 3"  (1.6 m)   Wt 66.2 kg (146 lb)   LMP 02/26/2018   SpO2 100%   BMI 25.86 kg/m   Physical Exam  Constitutional: She is oriented to person, place, and time. She appears well-developed and well-nourished.  HENT:  Head: Normocephalic.  Neck: Normal range of motion. Neck supple. No thyromegaly present.  Cardiovascular: Normal rate and regular rhythm.  No murmur heard. Pulmonary/Chest: Effort normal and breath sounds normal. She has no wheezes. She has no rales.  Abdominal: Soft. Bowel sounds are normal. There is no tenderness. There is no rebound and no guarding.  Musculoskeletal: Normal range of motion. She exhibits no edema or tenderness.  Neurological: She is alert and oriented to person,  place, and time.  Skin: Skin is warm and dry. No rash noted.  Psychiatric: She has a normal mood and affect.     ED Treatments / Results  Labs (all labs ordered are listed, but only abnormal results are displayed) Labs Reviewed  BASIC METABOLIC PANEL  CBC  TSH  I-STAT TROPONIN, ED  I-STAT BETA HCG BLOOD, ED ([facility], [facility], [facility] ONLY)   Results for orders placed or performed during the hospital encounter of 03/13/18  Basic metabolic panel  Result Value Ref Range   Sodium 138 135 - 145 mmol/L   Potassium 4.1 3.5 - 5.1 mmol/L   Chloride 105 101 - 111 mmol/L    CO2 25 22 - 32 mmol/L   Glucose, Bld 96 65 - 99 mg/dL   BUN 7 6 - 20 mg/dL   Creatinine, Ser 1.61 0.44 - 1.00 mg/dL   Calcium 9.1 8.9 - 09.6 mg/dL   GFR calc non Af Amer >60 >60 mL/min   GFR calc Af Amer >60 >60 mL/min   Anion gap 8 5 - 15  CBC  Result Value Ref Range   WBC 9.3 4.0 - 10.5 K/uL   RBC 4.41 3.87 - 5.11 MIL/uL   Hemoglobin 14.2 12.0 - 15.0 g/dL   HCT 04.5 40.9 - 81.1 %   MCV 92.1 78.0 - 100.0 fL   MCH 32.2 26.0 - 34.0 pg   MCHC 35.0 30.0 - 36.0 g/dL   RDW 91.4 78.2 - 95.6 %   Platelets 227 150 - 400 K/uL  I-stat troponin, ED  Result Value Ref Range   Troponin i, poc 0.00 0.00 - 0.08 ng/mL   Comment 3          I-Stat beta hCG blood, ED  Result Value Ref Range   I-stat hCG, quantitative <5.0 <5 mIU/mL   Comment 3            EKG  EKG Interpretation  Date/Time:  Monday March 12 2018 23:12:55 EDT Ventricular Rate:  67 PR Interval:  162 QRS Duration: 78 QT Interval:  392 QTC Calculation: 414 R Axis:   97 Text Interpretation:  Normal sinus rhythm with sinus arrhythmia Rightward axis Borderline ECG No previous ECGs available Confirmed by Zadie Rhine ([id number]) on 03/13/2018 4:30:40 AM       Radiology Dg Chest 2 View  Result Date: 03/12/2018 CLINICAL DATA:  Intermittent chest fluttering and discomfort. EXAM: CHEST - 2 VIEW COMPARISON:  None. FINDINGS: The heart size and mediastinal contours are within normal limits. Both lungs are clear. The visualized skeletal structures are unremarkable. IMPRESSION: No active cardiopulmonary disease. Electronically Signed   By: Tollie Eth M.D.   On: 03/12/2018 23:27    Procedures Procedures (including critical care time)  Medications Ordered in ED Medications - No data to display   Initial Impression / Assessment and Plan / ED Course  I have reviewed the triage vital signs and the nursing notes.  Pertinent labs & imaging results that were available during my care of the patient were reviewed by me and considered in  my medical decision making (see chart for details).     Patient presents with recent onset of episodic "skipping beats" palpitations. No pain, or SOB. No modifying factors.   EKG is NSR without premature contractions, or tachycardia. She is on oral contraceptives but denies SOB or chest pain. No tachycardia or hypoxia here. Doubt PE. CXR clear of infection. Lab studies r/o anemia or electrolyte imbalance. TSH pending for follow up.  Will refer to cardiology for further evaluation.   Final Clinical Impressions(s) / ED Diagnoses   Final diagnoses:  None   1. Palpitations   ED Discharge Orders    None       Elpidio AnisUpstill, Robley [place]assa, Cordelia Poche-C 03/13/18 0529    Zadie RhineWickline, Donald, MD 03/13/18 [phone]

## 2018-03-13 NOTE — ED Notes (Signed)
Pt verbalizes understanding of d/c instructions. Pt ambulatory at d/c with all belongings.   

## 2018-03-26 NOTE — Progress Notes (Signed)
Cardiology Office Note   Date:  03/27/2018   ID:  Renee Ferrell, DOB 02-Jan-1996, MRN [id number]  PCP:  None  Cardiologist:   No primary care provider on file. Referring:  None   No chief complaint on file.     History of Present Illness: Renee Ferrell is a 22 y.o. female who  Is referred by herself for evaluation of palpitations.   The patient has no past cardiac history.  She presented to the emergency room on the 18th and she said that a few days before that she started having "fluttering" in her chest.  She has a hard time describing this but she says it does happen every day now.  She feels skipped beats were isolated beats but not sustained tachyarrhythmias.  She might have it and then not have it for another hour.  She cannot associated with food or or drink.  Does not seem to matter whether she is active or not.  She feels that throughout the day.  She does not have any presyncope or syncope.  She denies any chest heaviness, neck or arm discomfort.  She has no new shortness of breath, PND or orthopnea.  Her EKG in the emergency room showed sinus arrhythmia but no significant dysrhythmia was otherwise unremarkable.  Labs were normal to include a TSH which was minimally elevated.   Past Medical History:  Diagnosis Date  . Renal disorder    hydronephrosis L    Past Surgical History:  Procedure Laterality Date  . KIDNEY SURGERY Left 2013   with stent placement and removal     No current outpatient medications on file.   No current facility-administered medications for this visit.     Allergies:   Patient has no known allergies.    Social History:  The patient  reports that she has never smoked. She has never used smokeless tobacco. She reports that she drinks alcohol. She reports that she does not use drugs.   Family History:  The patient's family history includes Diabetes in her paternal grandfather; Hypertension in her mother and paternal grandfather.    ROS:  Please see the  history of present illness.   Otherwise, review of systems are positive for none.   All other systems are reviewed and negative.    PHYSICAL EXAM: VS:  BP 120/88   Pulse 88   Ht 5\' 3"  (1.6 m)   Wt 143 lb (64.9 kg)   LMP 02/26/2018   SpO2 98%   BMI 25.33 kg/m  , BMI Body mass index is 25.33 kg/m. GENERAL:  Well appearing HEENT:  Pupils equal round and reactive, fundi not visualized, oral mucosa unremarkable NECK:  No jugular venous distention, waveform within normal limits, carotid upstroke brisk and symmetric, no bruits, no thyromegaly LYMPHATICS:  No cervical, inguinal adenopathy LUNGS:  Clear to auscultation bilaterally BACK:  No CVA tenderness CHEST:  Unremarkable HEART:  PMI not displaced or sustained,S1 and S2 within normal limits, no S3, no S4, no clicks, no rubs, no murmurs ABD:  Flat, positive bowel sounds normal in frequency in pitch, no bruits, no rebound, no guarding, no midline pulsatile mass, no hepatomegaly, no splenomegaly EXT:  2 plus pulses throughout, no edema, no cyanosis no clubbing SKIN:  No rashes no nodules NEURO:  Cranial nerves II through XII grossly intact, motor grossly intact throughout PSYCH:  Cognitively intact, oriented to person place and time    EKG:  EKG is not ordered today. The ekg ordered 03/12/18  demonstrates sinus arrhythmia, rate 67, axis within normal limits, intervals within normal limits, no acute ST-T wave changes.   Recent Labs: 04/15/2017: ALT 28 03/12/2018: BUN 7; Creatinine, Ser 0.72; Hemoglobin 14.2; Platelets 227; Potassium 4.1; Sodium 138 03/13/2018: TSH 5.768    Lipid Panel No results found for: CHOL, TRIG, HDL, CHOLHDL, VLDL, LDLCALC, LDLDIRECT    Wt Readings from Last 3 Encounters:  03/27/18 143 lb (64.9 kg)  03/12/18 146 lb (66.2 kg)  10/12/14 125 lb (56.7 kg) (48 %, Z= -0.04)*   * Growth percentiles are based on CDC (Girls, 2-20 Years) data.      Other studies Reviewed: Additional studies/ records that were  reviewed today include: ED records. Review of the above records demonstrates:  Please see elsewhere in the note.     ASSESSMENT AND PLAN:  Palpitations:   I suspect PACs but will apply 24-hour Holter.  I will also check a T3-T4.   Current medicines are reviewed at length with the patient today.  The patient does not have concerns regarding medicines.  The following changes have been made:  no change  Labs/ tests ordered today include:   Orders Placed This Encounter  Procedures  . T3  . T4, free  . HOLTER MONITOR - 24 HOUR     Disposition:   FU with me based on the results and future symptoms.     Signed, Rollene RotundaJames Nirel Babler, MD  03/27/2018 2:24 PM    [facility]

## 2018-03-27 ENCOUNTER — Ambulatory Visit (INDEPENDENT_AMBULATORY_CARE_PROVIDER_SITE_OTHER): Payer: Self-pay | Admitting: Cardiology

## 2018-03-27 ENCOUNTER — Encounter: Payer: Self-pay | Admitting: Cardiology

## 2018-03-27 VITALS — BP 120/88 | HR 88 | Ht 63.0 in | Wt 143.0 lb

## 2018-03-27 DIAGNOSIS — Z79899 Other long term (current) drug therapy: Secondary | ICD-10-CM

## 2018-03-27 DIAGNOSIS — R002 Palpitations: Secondary | ICD-10-CM

## 2018-03-27 DIAGNOSIS — R7989 Other specified abnormal findings of blood chemistry: Secondary | ICD-10-CM

## 2018-03-27 NOTE — Patient Instructions (Signed)
Medication Instructions:  Continue current medications  If you need a refill on your cardiac medications before your next appointment, please call your pharmacy.  Labwork: T3 and T4 Today HERE IN OUR OFFICE AT [facility]  Take the provided lab slips for you to take with you to the lab for you blood draw.   You will NOT need to fast   Testing/Procedures: Your physician has recommended that you wear a holter monitor for 24 hour. Holter monitors are medical devices that record the heart's electrical activity. Doctors most often use these monitors to diagnose arrhythmias. Arrhythmias are problems with the speed or rhythm of the heartbeat. The monitor is a small, portable device. You can wear one while you do your normal daily activities. This is usually used to diagnose what is causing palpitations/syncope (passing out).  Follow-Up: Your physician wants you to follow-up in: As Needed.     Thank you [facility] choosing [facility]!!

## 2019-07-15 ENCOUNTER — Other Ambulatory Visit: Payer: Self-pay

## 2019-07-15 DIAGNOSIS — Z20822 Contact with and (suspected) exposure to covid-19: Secondary | ICD-10-CM

## 2019-07-18 LAB — NOVEL CORONAVIRUS, NAA: SARS-CoV-2, NAA: NOT DETECTED

## 2019-11-28 ENCOUNTER — Other Ambulatory Visit: Payer: Self-pay

## 2019-11-28 DIAGNOSIS — Z20822 Contact with and (suspected) exposure to covid-19: Secondary | ICD-10-CM

## 2019-11-30 LAB — NOVEL CORONAVIRUS, NAA: SARS-CoV-2, NAA: NOT DETECTED

## 2020-08-05 ENCOUNTER — Encounter (HOSPITAL_COMMUNITY): Payer: Self-pay | Admitting: *Deleted

## 2020-08-05 ENCOUNTER — Emergency Department (HOSPITAL_COMMUNITY): Payer: Self-pay

## 2020-08-05 ENCOUNTER — Other Ambulatory Visit: Payer: Self-pay

## 2020-08-05 ENCOUNTER — Emergency Department (HOSPITAL_COMMUNITY)
Admission: EM | Admit: 2020-08-05 | Discharge: 2020-08-05 | Disposition: A | Discharge status: Home / Self Care | Payer: Self-pay | Attending: Emergency Medicine | Admitting: Emergency Medicine

## 2020-08-05 DIAGNOSIS — R000 Tachycardia, unspecified: Secondary | ICD-10-CM | POA: Insufficient documentation

## 2020-08-05 DIAGNOSIS — Z79899 Other long term (current) drug therapy: Secondary | ICD-10-CM | POA: Insufficient documentation

## 2020-08-05 DIAGNOSIS — J1282 Pneumonia due to coronavirus disease 2019: Secondary | ICD-10-CM

## 2020-08-05 DIAGNOSIS — U071 COVID-19: Secondary | ICD-10-CM | POA: Insufficient documentation

## 2020-08-05 LAB — BASIC METABOLIC PANEL
Anion gap: 13 (ref 5–15)
BUN: 8 mg/dL (ref 6–20)
CO2: 22 mmol/L (ref 22–32)
Calcium: 9.1 mg/dL (ref 8.9–10.3)
Chloride: 100 mmol/L (ref 98–111)
Creatinine, Ser: 0.7 mg/dL (ref 0.44–1.00)
GFR calc Af Amer: >60 mL/min (ref >60)
GFR calc non Af Amer: >60 mL/min (ref >60)
Glucose, Bld: 92 mg/dL (ref 70–99)
Potassium: 3.8 mmol/L (ref 3.5–5.1)
Sodium: 135 mmol/L (ref 135–145)

## 2020-08-05 LAB — CBC WITH DIFFERENTIAL/PLATELET
Abs Immature Granulocytes: 0.07 10*3/uL (ref 0.00–0.07)
Basophils Absolute: 0 10*3/uL (ref 0.0–0.1)
Basophils Relative: 1 %
Eosinophils Absolute: 0 10*3/uL (ref 0.0–0.5)
Eosinophils Relative: 0 %
HCT: 44.7 % (ref 36.0–46.0)
Hemoglobin: 15.7 g/dL — ABNORMAL HIGH (ref 12.0–15.0)
Immature Granulocytes: 2 %
Lymphocytes Relative: 38 %
Lymphs Abs: 1.8 10*3/uL (ref 0.7–4.0)
MCH: 31.6 pg (ref 26.0–34.0)
MCHC: 35.1 g/dL (ref 30.0–36.0)
MCV: 89.9 fL (ref 80.0–100.0)
Monocytes Absolute: 0.5 10*3/uL (ref 0.1–1.0)
Monocytes Relative: 12 %
Neutro Abs: 2.2 10*3/uL (ref 1.7–7.7)
Neutrophils Relative %: 47 %
Platelets: 153 10*3/uL (ref 150–400)
RBC: 4.97 MIL/uL (ref 3.87–5.11)
RDW: 12.6 % (ref 11.5–15.5)
WBC: 4.6 10*3/uL (ref 4.0–10.5)
nRBC: 0 % (ref 0.0–0.2)

## 2020-08-05 LAB — POC SARS CORONAVIRUS 2 AG -  ED: SARS Coronavirus 2 Ag: POSITIVE — AB

## 2020-08-05 LAB — I-STAT BETA HCG BLOOD, ED (MC, WL, AP ONLY): I-stat hCG, quantitative: <5 m[IU]/mL (ref <5)

## 2020-08-05 MED ORDER — ACETAMINOPHEN 325 MG PO TABS
650.0000 mg | ORAL_TABLET | Freq: Once | ORAL | Status: AC | PRN
  Administered 2020-08-05: 650 mg via ORAL
  Filled 2020-08-05: qty 2

## 2020-08-05 MED ORDER — ONDANSETRON HCL 4 MG/2ML IJ SOLN
4.0000 mg | Freq: Once | INTRAMUSCULAR | Status: AC
  Administered 2020-08-05: 18:00:00 4 mg via INTRAVENOUS
  Filled 2020-08-05: qty 2

## 2020-08-05 MED ORDER — ALBUTEROL SULFATE HFA 108 (90 BASE) MCG/ACT IN AERS
1.0000 | INHALATION_SPRAY | Freq: Once | RESPIRATORY_TRACT | Status: AC
  Administered 2020-08-05: 18:00:00 1 via RESPIRATORY_TRACT
  Filled 2020-08-05: qty 6.7

## 2020-08-05 MED ORDER — GUAIFENESIN ER 600 MG PO TB12: 600.0000 mg | ORAL_TABLET | Freq: Two times a day (BID) | ORAL | 0 refills | Status: DC | PRN

## 2020-08-05 MED ORDER — BENZONATATE 100 MG PO CAPS: 100.0000 mg | ORAL_CAPSULE | Freq: Three times a day (TID) | ORAL | 0 refills | Status: DC | PRN

## 2020-08-05 MED ORDER — SODIUM CHLORIDE 0.9 % IV BOLUS
500.0000 mL | Freq: Once | INTRAVENOUS | Status: AC
  Administered 2020-08-05: 18:00:00 500 mL via INTRAVENOUS

## 2020-08-05 MED ORDER — IOHEXOL 350 MG/ML SOLN
75.0000 mL | Freq: Once | INTRAVENOUS | Status: AC | PRN
  Administered 2020-08-05: 22:00:00 75 mL via INTRAVENOUS

## 2020-08-05 NOTE — ED Triage Notes (Signed)
To ED for eval of cough. States she had a video chat with pcp 2 wks ago and given abx for possible pneumonia. States she was tested 2 wks ago for covid and neg. Pt unvaccinated. No vomiting. Appear in nad.

## 2020-08-05 NOTE — ED Provider Notes (Signed)
[facility] EMERGENCY DEPARTMENT Provider Note   CSN: [id number] Arrival date & time: 08/05/20  1201     History Chief Complaint  Patient presents with  . Cough    Renee Ferrell is a 24 y.o. female with a past medical history of asthma in childhood, presenting to the ED with a chief complaint of cough, shortness of breath, vomiting.  Patient started having symptoms including nasal congestion and cough 2 weeks ago.  This was when she was in [place].  She initially thought this was due to the weather change.  1 week ago was seen at [facility] and had a negative Covid test.  She saw her PCP for virtual visit 2 days ago, thought to have pneumonia and was given antibiotics.  She states that at work she felt more short of breath which prompted her visit to the ER.  Reports this is worse with exertion.  Did have some episodes of nonbloody, nonbilious emesis yesterday.  Denies any diarrhea or abdominal pain.  Has had fevers which she has been taking Tylenol for.  Denies any chest pain, mopped assist, leg swelling.  She is unsure if she could be pregnant.  HPI     Past Medical History:  Diagnosis Date  . Renal disorder    hydronephrosis L    There are no problems to display for this patient.   Past Surgical History:  Procedure Laterality Date  . 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     Family History  Problem Relation Age of Onset  . Hypertension Mother   . Hypertension Paternal Grandfather   . Diabetes Paternal Grandfather     Social History   Tobacco Use  . Smoking status: Never Smoker  . Smokeless tobacco: Never Used  Substance Use Topics  . Alcohol use: Yes    Comment: socially  . Drug use: No    Home Medications Prior to Admission medications   Medication Sig Start Date End Date Taking? Authorizing Provider  acetaminophen (TYLENOL) 500 MG tablet Take 500-1,000 mg by mouth every 6 (six)  hours as needed for fever.   Yes [provider]  levofloxacin (LEVAQUIN) 500 MG tablet Take 500 mg by mouth daily. 08/03/20  Yes [provider]  Pseudoeph-Doxylamine-DM-APAP (DAYQUIL/NYQUIL COLD/FLU RELIEF PO) Take 1 capsule by mouth every 6 (six) hours as needed (for flu-like symptoms).   Yes [provider]  pseudoephedrine (SUDAFED) 30 MG tablet Take 30 mg by mouth every 4 (four) hours as needed for congestion.   Yes [provider]  TRI-SPRINTEC 0.18/0.215/0.25 MG-35 MCG tablet Take 1 tablet by mouth daily. 07/10/20  Yes [provider]    Allergies    Patient has no known allergies.  Review of Systems   Review of Systems  Constitutional: Negative for appetite change, chills and fever.  HENT: Negative for ear pain, rhinorrhea, sneezing and sore throat.   Eyes: Negative for photophobia and visual disturbance.  Respiratory: Negative for cough, chest tightness, shortness of breath and wheezing.   Cardiovascular: Negative for chest pain and palpitations.  Gastrointestinal: Negative for abdominal pain, blood in stool, constipation, diarrhea, nausea and vomiting.  Genitourinary: Negative for dysuria, hematuria and urgency.  Musculoskeletal: Negative for myalgias.  Skin: Negative for rash.  Neurological: Negative for dizziness, weakness and light-headedness.    Physical Exam Updated Vital Signs BP (!) 123/94   Pulse 87  Temp 98.9 F (37.2 C) (Oral)   Resp 16   LMP 07/28/2020 (Within Days)   SpO2 99%   Physical Exam Vitals and nursing note reviewed.  Constitutional:      General: She is not in acute distress.    Appearance: She is well-developed.  HENT:     Head: Normocephalic and atraumatic.     Nose: Nose normal.  Eyes:     General: No scleral icterus.       Right eye: No discharge.        Left eye: No discharge.     Conjunctiva/sclera: Conjunctivae normal.  Cardiovascular:     Rate and Rhythm: Regular rhythm. Tachycardia  present.     Heart sounds: Normal heart sounds. No murmur heard.  No friction rub. No gallop.   Pulmonary:     Effort: Pulmonary effort is normal. No respiratory distress.     Breath sounds: Normal breath sounds.  Abdominal:     General: Bowel sounds are normal. There is no distension.     Palpations: Abdomen is soft.     Tenderness: There is no abdominal tenderness. There is no guarding.  Musculoskeletal:        General: Normal range of motion.     Cervical back: Normal range of motion and neck supple.  Skin:    General: Skin is warm and dry.     Findings: No rash.  Neurological:     Mental Status: She is alert.     Motor: No abnormal muscle tone.     Coordination: Coordination normal.     ED Results / Procedures / Treatments   Labs (all labs ordered are listed, but only abnormal results are displayed) Labs Reviewed  CBC WITH DIFFERENTIAL/PLATELET - Abnormal; Notable for the following components:      Result Value   Hemoglobin 15.7 (*)    All other components within normal limits  POC SARS CORONAVIRUS 2 AG -  ED - Abnormal; Notable for the following components:   SARS Coronavirus 2 Ag POSITIVE (*)    All other components within normal limits  BASIC METABOLIC PANEL  I-STAT BETA HCG BLOOD, ED ([facility], [facility], [facility] ONLY)    EKG None  Radiology DG Chest Portable 1 View  Result Date: 08/05/2020 CLINICAL DATA:  Fever, cough, and shortness of breath for the past 2 weeks. COVID-19 positive. EXAM: PORTABLE CHEST 1 VIEW COMPARISON:  Chest x-ray dated March 12, 2018. FINDINGS: The heart size and mediastinal contours are within normal limits. Normal pulmonary vascularity. Peripheral wedge-shaped opacity in the left mid lung. No pleural effusion or pneumothorax. No acute osseous abnormality. IMPRESSION: 1. Isolated peripheral wedge-shaped opacity in the left mid lung. Appearance is atypical for COVID-19 pneumonia, but pneumonia remains the most likely diagnosis given clinical history. The  differential also includes pulmonary infarct. Consider CTA of the chest for further evaluation. Electronically Signed   By: Obie Dredge M.D.   On: 08/05/2020 18:10    Procedures Procedures (including critical care time)  Medications Ordered in ED Medications  acetaminophen (TYLENOL) tablet 650 mg (650 mg Oral Given 08/05/20 1234)  albuterol (VENTOLIN HFA) 108 (90 Base) MCG/ACT inhaler 1 puff (1 puff Inhalation Given 08/05/20 1739)  sodium chloride 0.9 % bolus 500 mL (0 mLs Intravenous Stopped 08/05/20 1844)  ondansetron (ZOFRAN) injection 4 mg (4 mg Intravenous Given 08/05/20 1738)    ED Course  I have reviewed the triage vital signs and the nursing notes.  Pertinent labs & imaging results  that were available during my care of the patient were reviewed by me and considered in my medical decision making (see chart for details).    MDM Rules/Calculators/A&P                          Renee Ferrell was evaluated in Emergency Department on 08/05/20 for the symptoms described in the history of present illness. He/she was evaluated in the context of the global COVID-19 pandemic, which necessitated consideration that the patient might be at risk for infection with the SARS-CoV-2 virus that causes COVID-19. Institutional protocols and algorithms that pertain to the evaluation of patients at risk for COVID-19 are in a state of rapid change based on information released by regulatory bodies including the CDC and federal and state organizations. These policies and algorithms were followed during the patient's care in the ED.  24 year old female with symptoms began as  nasal congestion and cough 2 weeks ago.  She did have a negative Covid test 1 week ago.  She saw her PCP for virtual visit 2 days ago, thought to have pneumonia and was given antibiotics ?levaquin? (she states it "starts with an L").  She felt more short of breath this morning which prompted her visit to the ER.  She does take OCPs but  discontinued 1 month ago.  Denies any unilateral leg swelling.  No chest pain although she does report chest tightness.  On exam lungs are clear to auscultation bilaterally.  Covid test is positive.  BMP, CBC and hCG are unremarkable.  Chest x-ray shows possible infiltrate versus pulmonary infarct.  Because of her recent Covid diagnosis and OCP use, will need to obtain CT angio of the chest to rule out PE.  Her EKG shows normal sinus rhythm.  She remains hemodynamically stable, not tachycardic, tachypneic or hypoxic. Care handed off to oncoming provider pending CTA.  All imaging, if done today, including plain films, CT scans, and ultrasounds, independently reviewed by me, and interpretations confirmed via formal radiology reads.  Portions of this note were generated with Scientist, clinical (histocompatibility and immunogenetics). Dictation errors may occur despite best attempts at proofreading.   Final Clinical Impression(s) / ED Diagnoses Final diagnoses:  COVID-19 virus infection    Rx / DC Orders ED Discharge Orders    None       Dietrich Pates, PA-C 08/05/20 2145    Tilden Fossa, MD 08/05/20 2311

## 2021-05-24 IMAGING — DX DG CHEST 1V PORT
1 series · 1 of 1 positions shown · non-contrast
Comparison: Chest x-ray dated March 12, 2018.

CLINICAL DATA: Fever, cough, and shortness of breath for the past 2
weeks. D9DKN-X4 positive.

EXAM:
PORTABLE CHEST 1 VIEW

[chest ap]
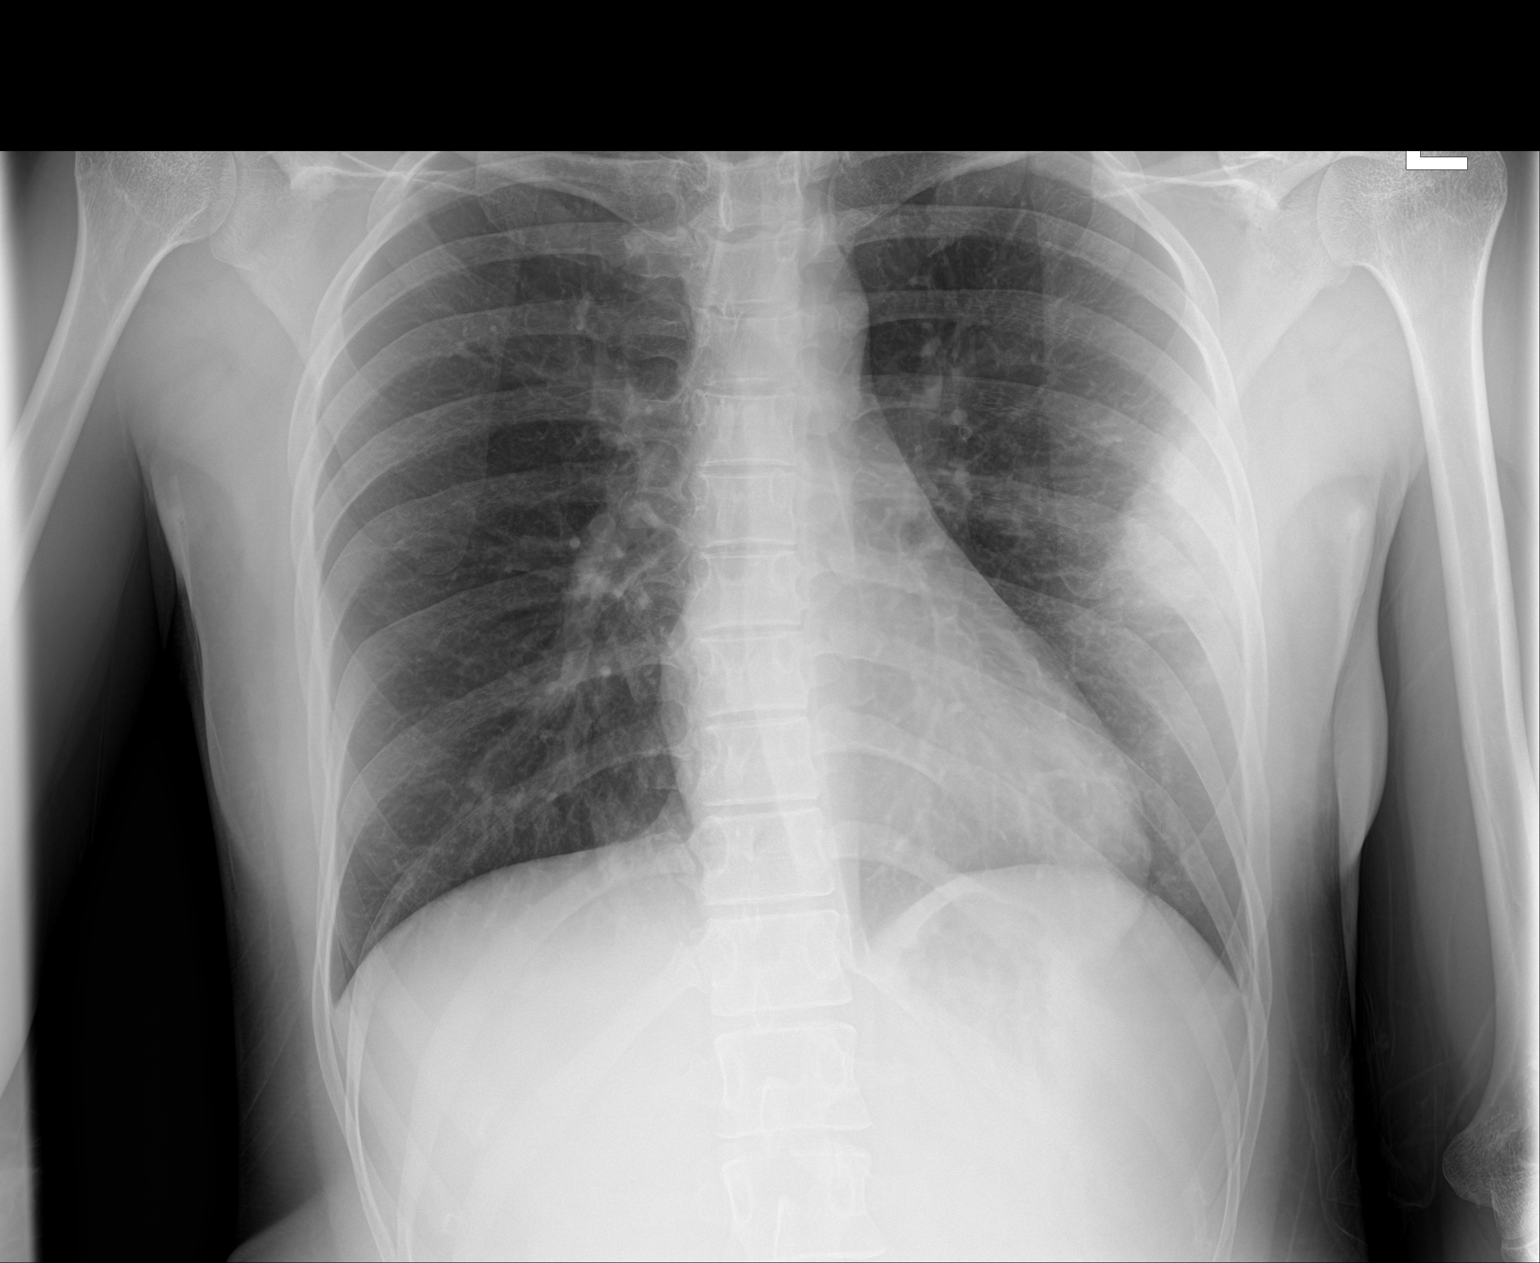

[1 of 1 positions shown; findings below may reference images not displayed]

FINDINGS: The heart size and mediastinal contours are within normal limits.
Normal pulmonary vascularity. Peripheral wedge-shaped opacity in the
left mid lung. No pleural effusion or pneumothorax. No acute osseous
abnormality.
IMPRESSION: 1. Isolated peripheral wedge-shaped opacity in the left mid lung.
Appearance is atypical for D9DKN-X4 pneumonia, but pneumonia remains
the most likely diagnosis given clinical history. The differential
also includes pulmonary infarct. Consider CTA of the chest for
further evaluation.

## 2021-09-07 ENCOUNTER — Ambulatory Visit (INDEPENDENT_AMBULATORY_CARE_PROVIDER_SITE_OTHER): Payer: Self-pay

## 2021-09-07 ENCOUNTER — Other Ambulatory Visit: Payer: Self-pay

## 2021-09-07 VITALS — BP 126/86 | HR 96 | Temp 97.8°F | Wt 157.0 lb

## 2021-09-07 DIAGNOSIS — Z3400 Encounter for supervision of normal first pregnancy, unspecified trimester: Secondary | ICD-10-CM | POA: Insufficient documentation

## 2021-09-07 NOTE — Patient Instructions (Signed)
 Genetic Screening Results Information: You are having genetic testing called [facility] today.  It will take approximately 2 weeks before the results are available.  To get your results, you need Internet access to a web browser to search Startex/MyChart (the direct app on your phone will not give you these results).  Then select Lab Scanned and click on the blue hyper link that says View Image to see your [facility] results.  You can also use the directions on the purple card given to look up your results directly on the [facility] website.   AREA PEDIATRIC/FAMILY PRACTICE PHYSICIANS  [facility] [address] [place], [place] [address] Phone - [phone]   Fax - [phone]  [facility] [address] [place], [place]  [address] Phone - [phone]   Fax - [phone]  [facility] [address] [place], [place]  [address] Phone - [phone]   Fax - [phone]  [facility] [address] [place], [place]  [address] Phone - [phone]   Fax - [phone]  [facility] [address] [place], [place]  [address] Phone - [phone]   Fax - [phone]  [facility] [address] [place], [place]  [address] Phone - [phone]   Fax - [phone]  [facility] [address] [place], [place]  [address] Phone - [phone]   Fax - [phone]  [facility] [address], [address] [place], [place]  [address] Phone - [phone]   Fax - [phone]  [facility] [address] [place], [place]  [address] Phone - [phone]   Fax - [phone] [facility] [address] [place], [place]  [address] Phone - [phone]   Fax - [phone]  [facility] [address] [place], [place]  [address] Phone -  [phone]   Fax - [phone]  [facility] [address] [place], [place]  [address] Phone - [phone]   Fax - [phone]  [facility] [address] [place], [place]  [address] Phone - [phone]   Fax - [phone]  [facility] [address] [place], [place]  [address] Phone - [phone]  [facility] [address] [place], [place]  [address] Phone - [phone]   Fax - [phone]  [facility] [address] [place], [place]  [address] Phone - [phone]   Fax - [phone]  [facility] [address] [place], [place]  [address] Phone - [phone]   Fax - [phone]  [facility] [address] [place], [place]  [address] Phone - [phone]   Fax - [phone]   [facility] [address] [place], [place]  [address] Phone - [phone]   Fax - [phone]  [facility] [address] [place], [place]  [address] Phone - [phone]   Fax - [phone]  [facility] [address] [place], [place]  [address] Phone - [phone]   Fax - [phone]  [facility] [address] [place], [place]  [address] Phone - [phone]   Fax - [phone]  [facility] [address] ,   [address] Phone - [phone]   Fax - [phone] [facility]   [address] [place], [place]  [address] Phone - [phone]   Fax - [phone]  [facility] [address] [place], [place]  [address] Phone - [phone]   Fax - [phone]  [facility] [address] [place], [place]  [address] Phone - [phone]   Fax - [phone]   

## 2021-09-07 NOTE — Progress Notes (Signed)
   Location:[facility]  Patient: Clinic Support with FOB  Provider: Clinic  PRENATAL INTAKE SUMMARY  Ms. Renee Ferrell presents today New OB Nurse Interview.  OB History     Gravida  1   Para      Term      Preterm      AB      Living         SAB      IAB      Ectopic      Multiple      Live Births             I have reviewed the patient's medical, obstetrical, social, and family histories, medications, and available lab results.  SUBJECTIVE She has no unusual complaints  OBJECTIVE Initial Nurse interview for history/labs (New OB)  EDD: 03/26/2022 by LMP  GA: [redacted]w[redacted]d G1P0 FHT: 181  GENERAL APPEARANCE: alert, well appearing, in no apparent distress, oriented to person, place and time   ASSESSMENT Normal pregnancy  PLAN Prenatal care:  [facility] Pnl/HIV/Hep C OB Urine Culture GC/CT/Pap at next visit With Renee Ferrell, CNM 09/23/2021  HgbEval/SMA/CF ([facility]) [facility] A1C Continue Prenatal Vitamins  BP Monitor Given To purchase Scale Sign up for babyscripts   Follow Up Instructions:   I discussed the assessment and treatment plan with the patient. The patient was provided an opportunity to ask questions and all were answered. The patient agreed with the plan and demonstrated an understanding of the instructions.   The patient was advised to call back or seek an in-person evaluation if the symptoms worsen or if the condition fails to improve as anticipated.  I provided 40 minutes of  face-to-face time during this encounter.  Gearldine Shown, CMA

## 2021-09-09 LAB — OBSTETRIC PANEL, INCLUDING HIV
Antibody Screen: NEGATIVE
Basophils Absolute: 0.1 10*3/uL (ref 0.0–0.2)
Basos: 1 %
EOS (ABSOLUTE): 0.2 10*3/uL (ref 0.0–0.4)
Eos: 2 %
HIV Screen 4th Generation wRfx: NONREACTIVE
Hematocrit: 41.9 % (ref 34.0–46.6)
Hemoglobin: 14.1 g/dL (ref 11.1–15.9)
Hepatitis B Surface Ag: NEGATIVE
Immature Grans (Abs): 0.1 10*3/uL (ref 0.0–0.1)
Immature Granulocytes: 1 %
Lymphocytes Absolute: 1.3 10*3/uL (ref 0.7–3.1)
Lymphs: 15 %
MCH: 31.7 pg (ref 26.6–33.0)
MCHC: 33.7 g/dL (ref 31.5–35.7)
MCV: 94 fL (ref 79–97)
Monocytes Absolute: 0.5 10*3/uL (ref 0.1–0.9)
Monocytes: 6 %
Neutrophils Absolute: 7.1 10*3/uL — ABNORMAL HIGH (ref 1.4–7.0)
Neutrophils: 75 %
Platelets: 197 10*3/uL (ref 150–450)
RBC: 4.45 x10E6/uL (ref 3.77–5.28)
RDW: 12.4 % (ref 11.7–15.4)
RPR Ser Ql: NONREACTIVE
Rh Factor: POSITIVE
Rubella Antibodies, IGG: 7.91 index (ref >0.99)
WBC: 9.3 10*3/uL (ref 3.4–10.8)

## 2021-09-09 LAB — URINE CULTURE, OB REFLEX

## 2021-09-09 LAB — CULTURE, OB URINE

## 2021-09-09 LAB — HEPATITIS C ANTIBODY: Hep C Virus Ab: <0.1 s/co ratio (ref 0.0–0.9)

## 2021-09-23 ENCOUNTER — Ambulatory Visit (INDEPENDENT_AMBULATORY_CARE_PROVIDER_SITE_OTHER): Payer: Self-pay | Admitting: Obstetrics and Gynecology

## 2021-09-23 ENCOUNTER — Encounter: Payer: Self-pay | Admitting: Obstetrics and Gynecology

## 2021-09-23 ENCOUNTER — Other Ambulatory Visit: Payer: Self-pay

## 2021-09-23 VITALS — BP 125/81 | HR 105 | Wt 156.2 lb

## 2021-09-23 DIAGNOSIS — Z3A13 13 weeks gestation of pregnancy: Secondary | ICD-10-CM

## 2021-09-23 DIAGNOSIS — O99019 Anemia complicating pregnancy, unspecified trimester: Secondary | ICD-10-CM

## 2021-09-23 DIAGNOSIS — D573 Sickle-cell trait: Secondary | ICD-10-CM

## 2021-09-23 DIAGNOSIS — Z3400 Encounter for supervision of normal first pregnancy, unspecified trimester: Secondary | ICD-10-CM

## 2021-09-23 NOTE — Patient Instructions (Signed)
Second Trimester of Pregnancy The second trimester of pregnancy is from week 13 through week 27. This is months 4 through 6 of pregnancy. The second trimester is often a time when you feel your best. Your body has adjusted to being pregnant, and you begin to feel better physically. During the second trimester: Morning sickness has lessened or stopped completely. You may have more energy. You may have an increase in appetite. The second trimester is also a time when the unborn baby (fetus) is growing rapidly. At the end of the sixth month, the fetus may be up to 12 inches long and weigh about 1 pounds. You will likely begin to feel the baby move (quickening) between 16 and 20 weeks of pregnancy. Body changes during your second trimester Your body continues to go through many changes during your second trimester. The changes vary and generally return to normal after the baby is born. Physical changes Your weight will continue to increase. You will notice your lower abdomen bulging out. You may begin to get stretch marks on your hips, abdomen, and breasts. Your breasts will continue to grow and to become tender. Dark spots or blotches (chloasma or mask of pregnancy) may develop on your face. A dark line from your belly button to the pubic area (linea nigra) may appear. You may have changes in your hair. These can include thickening of your hair, rapid growth, and changes in texture. Some people also have hair loss during or after pregnancy, or hair that feels dry or thin. Health changes You may develop headaches. You may have heartburn. You may develop constipation. You may develop hemorrhoids or swollen, bulging veins (varicose veins). Your gums may bleed and may be sensitive to brushing and flossing. You may urinate more often because the fetus is pressing on your bladder. You may have back pain. This is caused by: Weight gain. Pregnancy hormones that are relaxing the joints in your  pelvis. A shift in weight and the muscles that support your balance. Follow these instructions at home: Medicines Follow your health care provider's instructions regarding medicine use. Specific medicines may be either safe or unsafe to take during pregnancy. Do not take any medicines unless approved by your health care provider. Take a prenatal vitamin that contains at least 600 micrograms (mcg) of folic acid. Eating and drinking Eat a healthy diet that includes fresh fruits and vegetables, whole grains, good sources of protein such as meat, eggs, or tofu, and low-fat dairy products. Avoid raw meat and unpasteurized juice, milk, and cheese. These carry germs that can harm you and your baby. You may need to take these actions to prevent or treat constipation: Drink enough fluid to keep your urine pale yellow. Eat foods that are high in fiber, such as beans, whole grains, and fresh fruits and vegetables. Limit foods that are high in fat and processed sugars, such as fried or sweet foods. Activity Exercise only as directed by your health care provider. Most people can continue their usual exercise routine during pregnancy. Try to exercise for 30 minutes at least 5 days a week. Stop exercising if you develop contractions in your uterus. Stop exercising if you develop pain or cramping in the lower abdomen or lower back. Avoid exercising if it is very hot or humid or if you are at a high altitude. Avoid heavy lifting. If you choose to, you may have sex unless your health care provider tells you not to. Relieving pain and discomfort Wear a supportive bra  to prevent discomfort from breast tenderness. Take warm sitz baths to soothe any pain or discomfort caused by hemorrhoids. Use hemorrhoid cream if your health care provider approves. Rest with your legs raised (elevated) if you have leg cramps or low back pain. If you develop varicose veins: Wear support hose as told by your health care  provider. Elevate your feet for 15 minutes, 3-4 times a day. Limit salt in your diet. Safety Wear your seat belt at all times when driving or riding in a car. Talk with your health care provider if someone is verbally or physically abusive to you. Lifestyle Do not use hot tubs, steam rooms, or saunas. Do not douche. Do not use tampons or scented sanitary pads. Avoid cat litter boxes and soil used by cats. These carry germs that can cause birth defects in the baby and possibly loss of the fetus by miscarriage or stillbirth. Do not use herbal remedies, alcohol, illegal drugs, or medicines that are not approved by your health care provider. Chemicals in these products can harm your baby. Do not use any products that contain nicotine or tobacco, such as cigarettes, e-cigarettes, and chewing tobacco. If you need help quitting, ask your health care provider. General instructions During a routine prenatal visit, your health care provider will do a physical exam and other tests. He or she will also discuss your overall health. Keep all follow-up visits. This is important. Ask your health care provider for a referral to a local prenatal education class. Ask for help if you have counseling or nutritional needs during pregnancy. Your health care provider can offer advice or refer you to specialists for help with various needs. Where to find more information American Pregnancy Association: americanpregnancy.org [employer] of [employer]stetricians and Gynecologists: https://www.todd-brady.net/ Office on [employer] Health: MightyReward.co.nz Contact a health care provider if you have: A headache that does not go away when you take medicine. Vision changes or you see spots in front of your eyes. Mild pelvic cramps, pelvic pressure, or nagging pain in the abdominal area. Persistent nausea, vomiting, or diarrhea. A bad-smelling vaginal discharge or foul-smelling urine. Pain when you  urinate. Sudden or extreme swelling of your face, hands, ankles, feet, or legs. A fever. Get help right away if you: Have fluid leaking from your vagina. Have spotting or bleeding from your vagina. Have severe abdominal cramping or pain. Have difficulty breathing. Have chest pain. Have fainting spells. Have not felt your baby move for the time period told by your health care provider. Have new or increased pain, swelling, or redness in an arm or leg. Summary The second trimester of pregnancy is from week 13 through week 27 (months 4 through 6). Do not use herbal remedies, alcohol, illegal drugs, or medicines that are not approved by your health care provider. Chemicals in these products can harm your baby. Exercise only as directed by your health care provider. Most people can continue their usual exercise routine during pregnancy. Keep all follow-up visits. This is important. This information is not intended to replace advice given to you by your health care provider. Make sure you discuss any questions you have with your health care provider. Document Revised: 05/20/2020 Document Reviewed: 03/26/2020 Elsevier Patient Education  2022 Elsevier Inc.  What You Should Know About Sickle Cell Trait What is sickle cell trait? Sickle cell trait (SCT) is not a mild form of sickle cell disease. Having SCT simply means that a person carries a single gene for sickle cell disease (SCD) and  can pass this gene along to their children. People with SCT usually do not have any of the symptoms of SCD and live a normal life. Hemoglobin is found in red blood cells and it gives blood its color. It carries oxygen to all parts of the body. Hemoglobin is made from two similar proteins, one called alpha-globin and one called beta-globin, that "stick together." Both proteins must be present and function normally for the hemoglobin to carry out its job in the body. People with SCT have red blood cells that have normal  hemoglobin and abnormal hemoglobin. Genes are the instructions that control how red blood cells make alpha- and beta-globin proteins. All people have two genes for making beta-globin. They get one beta-globin gene from each parent. SCT occurs when a person inherits a gene for sickle beta-globin from one parent and a gene for normal beta-globin from the other parent. This means the person won't have sickle cell disease, but will be a trait "carrier" and can pass it on to their children. What is sickle cell disease? SCD is a genetic condition that is present at birth. In SCD, the red blood cells become hard and sticky and look like a C-shaped farm tool called a "sickle." The sickle cells die early, which causes a constant shortage of red blood cells. Also, when they travel through small blood vessels, they get stuck and clog the blood flow. This can cause pain and other serious problems. It is inherited when a child receives two sickle beta-globin genes--one from each parent. Therefore, a child can only have SCD when both of his/her parents have at least one abnormal beta-globin gene. What are the chances that a baby will have sickle cell trait or sickle cell disease? The most important thing to know about having SCT is that you could have a baby with SCD if your partner also has an abnormal hemoglobin gene. Who is affected by sickle cell trait? SCT is more common among people whose ancestors come from [place], the [place] region, [place], and [place], but anyone can have SCT. 1 in 12 blacks or African Americans in the [place] has SCT. If both parents have SCT, each child that they have together has a 1 in 2 (50%) chance of having SCT. Children with SCT will not have symptoms of SCD, but they can pass SCT on to their children. 1 in 4 (25%) chance of having sickle cell anemia, one of several types of SCD. Sickle cell anemia is a serious medical condition. 1 in 4 (25%) chance that they will  not have SCD or SCT. If one parent has SCT and the other parent has another abnormal hemoglobin gene (like hemoglobin C trait or beta-thalassemia trait), each of their children has a 1 in 2 (50%) chance of having SCT. 1 in 4 (25%) chance of having SCD (not sickle cell anemia). These other types of SCD can be more or less severe depending on the specific abnormal hemoglobin gene. 1 in 4 (25%) chance that they will not have SCD or SCT. If only one parent has SCT, each of their children has a 1 in 2 (50%) chance of having SCT. 1 in 2 (50%) chance that they will not have SCT. What health problems might occur in people with sickle cell trait? Most people with SCT do not have any health problems caused by sickle cell trait. However, there are a few, rare health problems that may potentially be related to SCT. For example, if  people with SCT have pain when traveling to or exercising at high altitudes, they should tell their healthcare provider. People with SCT and eye trauma should seek out medical attention and inform the physician about the trait status. People with SCT should drink plenty of water during exercise. People with SCT should contact and inform their doctor if they notice blood in their urine. To find out more about SCT and to get specific answers to your questions, call your healthcare provider. How will a person know if he or she has sickle cell trait? To find out if you have SCT, your doctor needs to order a blood test. If you find out you and/or your loved one has SCT, talk to your healthcare provider and/or a genetic counselor about what that means. It is important that you know what SCT is and how it can affect you and your family. For more information visit: [email or web address] U.S. Department of Health and Advertising copywriter for Disease Control and Prevention American Society of Hematology SCDAA "Break The Sickle Cycle" This information is not intended to replace advice given  to you by your health care provider. Make sure you discuss any questions you have with your health care provider. Document Revised: 09/15/2020 Document Reviewed: 09/15/2020 Elsevier Patient Education  2022 [employer].

## 2021-09-23 NOTE — Progress Notes (Signed)
INITIAL OBSTETRICAL VISIT Patient name: Renee Ferrell MRN [id number]  Date of birth: 30-Jan-1996 Chief Complaint:   Initial Prenatal Visit  History of Present Illness:   Renee Ferrell is a 25 y.o. G1P0 Hispanic female at [redacted]w[redacted]d by LMP with an Estimated Date of Delivery: 03/26/22 being seen today for her initial obstetrical visit.  Her obstetrical history is significant for  none . This is a planned pregnancy. She and the father of the baby (FOB) "Tammy Sours" do not live together; he lives in [place], [place]. She has a support system that consists of the FOB/family/friends. Today she reports left lower backache that causes LT leg pain; "causes hobbling."   Patient's last menstrual period was 06/19/2021 (within days). Last pap 2 months ago at a free clinic. Results were: normal Review of Systems:   Pertinent items are noted in HPI Denies cramping/contractions, leakage of fluid, vaginal bleeding, abnormal vaginal discharge w/ itching/odor/irritation, headaches, visual changes, shortness of breath, chest pain, abdominal pain, severe nausea/vomiting, or problems with urination or bowel movements unless otherwise stated above.  Pertinent History Reviewed:  Reviewed past medical,surgical, social, obstetrical and family history.  Reviewed problem list, medications and allergies. OB History  Gravida Para Term Preterm AB Living  1            SAB IAB Ectopic Multiple Live Births               # Outcome Date GA Lbr Len/2nd Weight Sex Delivery Anes PTL Lv  1 Current            Physical Assessment:   Vitals:   09/23/21 1018  BP: 125/81  Pulse: (!) 105  Weight: 156 lb 3.2 oz (70.9 kg)  Body mass index is 27.67 kg/m.       Physical Examination:  General appearance - well appearing, and in no distress  Mental status - alert, oriented to person, place, and time  Psych:  She has a normal mood and affect  Skin - warm and dry, normal color, no suspicious lesions noted  Chest - effort normal, all lung fields clear to  auscultation bilaterally  Heart - normal rate and regular rhythm  Abdomen - soft, nontender  Extremities:  No swelling or varicosities noted  Pelvic - VULVA: normal appearing vulva with no masses, tenderness or lesions  VAGINA: normal appearing vagina with normal color and discharge, no lesions.   CERVIX: normal appearing cervix without discharge or lesions, no CMT  Thin prep pap is not done  FHTs by doppler: 143 bpm  Assessment & Plan:  1) Low-Risk Pregnancy G1P0 at [redacted]w[redacted]d with an Estimated Date of Delivery: 03/26/22   2) Initial OB visit - Welcomed to practice and introduced self to patient in addition to discussing other advanced practice providers that she may be seeing at this practice - Congratulated patient - Anticipatory guidance on upcoming appointments - Educated on COVID19 and pregnancy and the integration of virtual appointments  - Educated on babyscripts app- patient reports she has not received email, encouraged to look in spam folder and to call office if she still has not received email - patient verbalizes understanding - Anatomy U/S to be scheduled for November  3) [redacted] weeks gestation of pregnancy   4) Sickle cell trait in mother affecting pregnancy (HCC)  - Offered Horizon testing to FOB - patient will pick up kit for him later next week  Meds: No orders of the defined types were placed in this encounter.   Initial labs  obtained Continue prenatal vitamins Reviewed n/v relief measures and warning s/s to report Reviewed recommended weight gain based on pre-gravid BMI Encouraged well-balanced diet Genetic Screening discussed: results reviewed Cystic fibrosis, SMA, Fragile X screening discussed results reviewed The nature of [facility] with multiple MDs and other Advanced Practice Providers was explained to patient; also emphasized that residents, students are part of our team.  Discussed optimized OB schedule and video visits.  Advised can have an in-office visit whenever she feels she needs to be seen.  Does own BP cuff. Explained to patient that BP will be mailed to her house. Check BP weekly, let us know if >140/90. Advised to call during normal business hours and there is an after-hours nurse line available.    Follow-up: Return in about 4 weeks (around 10/21/2021) for Return OB - My Chart video.   No orders of the defined types were placed in this encounter.   Laury Deep MSN, CNM 09/23/2021

## 2021-09-23 NOTE — Progress Notes (Signed)
Patient is here for her new ob.  Last pap 3 years ago

## 2021-10-21 ENCOUNTER — Telehealth (INDEPENDENT_AMBULATORY_CARE_PROVIDER_SITE_OTHER): Payer: Self-pay | Admitting: Obstetrics and Gynecology

## 2021-10-21 ENCOUNTER — Other Ambulatory Visit: Payer: Self-pay

## 2021-10-21 ENCOUNTER — Encounter: Payer: Self-pay | Admitting: Obstetrics and Gynecology

## 2021-10-21 VITALS — BP 122/80 | HR 93 | Wt 160.0 lb

## 2021-10-21 DIAGNOSIS — Z3402 Encounter for supervision of normal first pregnancy, second trimester: Secondary | ICD-10-CM

## 2021-10-21 DIAGNOSIS — Z3400 Encounter for supervision of normal first pregnancy, unspecified trimester: Secondary | ICD-10-CM

## 2021-10-21 DIAGNOSIS — O2200 Varicose veins of lower extremity in pregnancy, unspecified trimester: Secondary | ICD-10-CM

## 2021-10-21 DIAGNOSIS — O2202 Varicose veins of lower extremity in pregnancy, second trimester: Secondary | ICD-10-CM

## 2021-10-21 DIAGNOSIS — Z3A17 17 weeks gestation of pregnancy: Secondary | ICD-10-CM

## 2021-10-21 NOTE — Progress Notes (Addendum)
MY CHART VIDEO VIRTUAL OBSTETRICS VISIT ENCOUNTER NOTE  I connected with Renee Ferrell on 10/21/21 at 10:15 AM EDT by My Chart video at home and verified that I am speaking with the correct person using two identifiers. FOB, Tammy Sours, on phone with patient listening in on visit through her phone with patient's consent. Provider located at [employer] for [employer] at [place].   I discussed the limitations, risks, security and privacy concerns of performing an evaluation and management service by My Chart video and the availability of in person appointments. I also discussed with the patient that there may be a patient responsible charge related to this service. The patient expressed understanding and agreed to proceed.  I discussed the limitations of telemedicine and the availability of in person appointments.  Discussed there is a possibility of technology failure and discussed alternative modes of communication if that failure occurs.  Subjective:  Renee Ferrell is a 25 y.o. G1P0 at [redacted]w[redacted]d being followed for ongoing prenatal care.  She is currently monitored for the following issues for this low-risk pregnancy and has Supervision of normal first pregnancy, antepartum on their problem list.  Patient reports  saw a bulge in lower LT leg above ankle after working/standing an 8 hour shift  . Not sure what it is. Reports no pain in that area. Wants to know if it is safe for her to fly 01/11/2022 and the week after that. Reports fetal movement. Denies any contractions, bleeding or leaking of fluid.   The following portions of the patient's history were reviewed and updated as appropriate: allergies, current medications, past family history, past medical history, past social history, past surgical history and problem list.   Objective:   General:  Alert, oriented and cooperative.   Mental Status: Normal mood and affect perceived. Normal judgment and thought content.  Rest of physical exam deferred due to  type of encounter  BP 122/80   Pulse 93   Wt 160 lb (72.6 kg)   LMP 06/19/2021 (Within Days)   BMI 28.34 kg/m  **Done by patient's own at home BP cuff and scale  Assessment and Plan:  Pregnancy: G1P0 at [redacted]w[redacted]d  1. Supervision of normal first pregnancy, antepartum - Discussed AFP at [facility]  - FOB, Tammy Sours, reports testing negative for [facility] before being allowed to play football in college; unsure of locating documentation - Will be 29.3 weeks on 01/11/2022, will be safe to fly at that time.  - Advised of recommendation to stop long-distance travel after 36 weeks  2. Varicose veins during pregnancy  - Explained that pregnancy and standing for long periods of time on hard floors can cause or worsen varicose veins. - Recommends wearing compression socks while working or standing/walking long periods of time. 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 [redacted] weeks gestation of pregnancy   Preterm labor symptoms and general obstetric precautions including but not limited to vaginal bleeding, contractions, leaking of fluid and fetal movement were reviewed in detail with the patient.  I discussed the assessment and treatment plan with the patient. The patient was provided an opportunity to ask questions and all were answered. The patient agreed with the plan and demonstrated an understanding of the instructions. The patient was advised to call back or seek an in-person office evaluation/go to [facility] at [facility]  for any urgent or concerning symptoms. Please refer to After Visit Summary for other counseling recommendations.   I provided 10 minutes of non-face-to-face time during this encounter. There was 5 minutes of chart review time spent prior to this encounter. Total time spent = 15 minutes.  Return in about 4 weeks (around 11/18/2021) for Return OB  visit - AFP.  Future Appointments  Date Time Provider Department Center  11/17/2021  9:15 AM [place]Kirby, Wilmer Floor, CNM [facility] None    Raelyn Mora, CNM [facility] for [facility] [facility], [facility]

## 2021-10-21 NOTE — Patient Instructions (Signed)
How to Use Compression Stockings Compression stockings are elastic socks that squeeze the legs. They help increase blood flow (circulation) to the legs, decrease swelling in the legs, and reduce the chance of developing blood clots in the lower legs. Compression stockings are often used by people who: Are recovering from surgery. Have poor circulation in their legs. Tend to get blood clots in their legs. Have bulging (varicose) veins. Sit or stay in bed for long periods of time. Follow instructions from your health care provider about how and when to wear your compression stockings. How to wear compression stockings Before you put on your compression stockings: Make sure that they are the correct size and degree of compression. If you do not know your size or required grade of compression, ask your health care provider and follow the manufacturer's instructions that come with the stockings. Make sure that they are clean, dry, and in good condition. Check them for rips and tears. Do not put them on if they are ripped or torn. Put your stockings on first thing in the morning, before you get out of bed. Keep them on for as long as your health care provider advises. When you are wearing your stockings: Keep them as smooth as possible. Do not allow them to bunch up. It is especially important to prevent the stockings from bunching up around your toes or behind your knees. Do not roll the stockings downward and leave them rolled down. This can decrease blood flow to your leg. Change them right away if they become wet or dirty. When you take off your stockings, inspect your legs and feet. Check for: Open sores. Red spots. Swelling. General tips Do not stop wearing compression stockings without talking to your health care provider first. Wash your stockings every day with mild detergent in cold or warm water. Do not use bleach. Air-dry your stockings or dry them in a clothes dryer on low heat. It may  be helpful to have two pairs so that you have a pair to wear while the other is being washed. Replace your stockings every 3-6 months. If skin moisturizing is part of your treatment plan, apply lotion or cream at night so that your skin will be dry when you put on the stockings in the morning. It is harder to put the stockings on when you have lotion on your legs or feet. Wear nonskid shoes or slip-resistant socks when walking while wearing compression stockings. Contact a health care provider and remove your stockings if you have: A feeling of pins and needles in your feet or legs. Open sores, red spots, or other skin changes on your feet or legs. Swelling or pain that gets worse. Get help right away if you have: Numbness or tingling in your lower legs that does not get better right after you take the stockings off. Toes or feet that are unusually cold or turn a bluish color. A warm or red area on your leg. New swelling or soreness in your leg. Shortness of breath. Chest pain. A fast or irregular heartbeat. Light-headedness. Dizziness. Summary Compression stockings are elastic socks that squeeze the legs. They help increase blood flow (circulation) to the legs, decrease swelling in the legs, and reduce the chance of developing blood clots in the lower legs. Follow instructions from your health care provider about how and when to wear your compression stockings. Do not stop wearing your compression stockings without talking to your health care provider first. This information is not intended  to replace advice given to you by your health care provider. Make sure you discuss any questions you have with your health care provider. Document Revised: 03/24/2020 Document Reviewed: 04/15/2020 Elsevier Patient Education  2022 [employer].

## 2021-11-17 ENCOUNTER — Ambulatory Visit (INDEPENDENT_AMBULATORY_CARE_PROVIDER_SITE_OTHER): Payer: Self-pay | Admitting: Advanced Practice Midwife

## 2021-11-17 ENCOUNTER — Other Ambulatory Visit: Payer: Self-pay

## 2021-11-17 VITALS — BP 123/84 | HR 114 | Wt 162.8 lb

## 2021-11-17 DIAGNOSIS — Z3A21 21 weeks gestation of pregnancy: Secondary | ICD-10-CM

## 2021-11-17 DIAGNOSIS — O99891 Other specified diseases and conditions complicating pregnancy: Secondary | ICD-10-CM

## 2021-11-17 DIAGNOSIS — M549 Dorsalgia, unspecified: Secondary | ICD-10-CM

## 2021-11-17 DIAGNOSIS — Z3400 Encounter for supervision of normal first pregnancy, unspecified trimester: Secondary | ICD-10-CM

## 2021-11-17 NOTE — Progress Notes (Signed)
   PRENATAL VISIT NOTE  Subjective:  Renee Ferrell is a 24 y.o. G1P0 at [redacted]w[redacted]d being seen today for ongoing prenatal care.  She is currently monitored for the following issues for this low-risk pregnancy and has Supervision of normal first pregnancy, antepartum on their problem list.  Patient reports backache.  Contractions: Not present. Vag. Bleeding: None.  Movement: Present. Denies leaking of fluid.   The following portions of the patient's history were reviewed and updated as appropriate: allergies, current medications, past family history, past medical history, past social history, past surgical history and problem list.   Objective:   Vitals:   11/17/21 0924  BP: 123/84  Pulse: (!) 114  Weight: 162 lb 12.8 oz (73.8 kg)    Fetal Status: Fetal Heart Rate (bpm): 152   Movement: Present     General:  Alert, oriented and cooperative. Patient is in no acute distress.  Skin: Skin is warm and dry. No rash noted.   Cardiovascular: Normal heart rate noted  Respiratory: Normal respiratory effort, no problems with respiration noted  Abdomen: Soft, gravid, appropriate for gestational age.  Pain/Pressure: Absent     Pelvic: Cervical exam deferred        Extremities: Normal range of motion.  Edema: None  Mental Status: Normal mood and affect. Normal behavior. Normal judgment and thought content.   Assessment and Plan:  Pregnancy: G1P0 at [redacted]w[redacted]d 1. Supervision of normal first pregnancy, antepartum --Anticipatory guidance about next visits/weeks of pregnancy given. --Anatomy US with [place] scheduled Monday --Next visit in 4 weeks  2. [redacted] weeks gestation of pregnancy   3. Back pain affecting pregnancy in second trimester --Pain in low back since early pregnancy, radiates down left leg, some numbness or difficulty standing when she has been sitting too long --May try chiropractic, is self pay so will talk with PT about cost.  Referral for PT placed. --Reviewed safety of chiropractic in  pregnancy --Ice for sciatica, pregnancy support belt recommended - Ambulatory referral to Physical Therapy   Preterm labor symptoms and general obstetric precautions including but not limited to vaginal bleeding, contractions, leaking of fluid and fetal movement were reviewed in detail with the patient. Please refer to After Visit Summary for other counseling recommendations.   Return in about 4 weeks (around 12/15/2021).  Future Appointments  Date Time Provider Department Center  12/08/2021  9:15 AM Brand Males, CNM [facility] None    Sharen Counter, CNM

## 2021-11-29 ENCOUNTER — Encounter (HOSPITAL_COMMUNITY): Payer: Self-pay | Admitting: Family Medicine

## 2021-11-29 ENCOUNTER — Encounter: Payer: Self-pay | Admitting: Obstetrics and Gynecology

## 2021-11-29 ENCOUNTER — Other Ambulatory Visit: Payer: Self-pay

## 2021-11-29 ENCOUNTER — Inpatient Hospital Stay (HOSPITAL_COMMUNITY)
Admission: AD | Admit: 2021-11-29 | Discharge: 2021-11-29 | Disposition: A | Discharge status: Home / Self Care | Payer: Self-pay | Attending: Family Medicine | Admitting: Family Medicine

## 2021-11-29 ENCOUNTER — Encounter: Payer: Self-pay | Admitting: Advanced Practice Midwife

## 2021-11-29 DIAGNOSIS — M79604 Pain in right leg: Secondary | ICD-10-CM | POA: Insufficient documentation

## 2021-11-29 DIAGNOSIS — Z3A23 23 weeks gestation of pregnancy: Secondary | ICD-10-CM | POA: Insufficient documentation

## 2021-11-29 DIAGNOSIS — O26892 Other specified pregnancy related conditions, second trimester: Secondary | ICD-10-CM | POA: Insufficient documentation

## 2021-11-29 DIAGNOSIS — M79661 Pain in right lower leg: Secondary | ICD-10-CM

## 2021-11-29 DIAGNOSIS — Z3400 Encounter for supervision of normal first pregnancy, unspecified trimester: Secondary | ICD-10-CM

## 2021-11-29 LAB — URINALYSIS, ROUTINE W REFLEX MICROSCOPIC
Bilirubin Urine: NEGATIVE
Glucose, UA: NEGATIVE mg/dL
Hgb urine dipstick: NEGATIVE
Ketones, ur: NEGATIVE mg/dL
Leukocytes,Ua: NEGATIVE
Nitrite: NEGATIVE
Protein, ur: NEGATIVE mg/dL
Specific Gravity, Urine: 1.01 (ref 1.005–1.030)
pH: 6.5 (ref 5.0–8.0)

## 2021-11-29 NOTE — Telephone Encounter (Signed)
Called patient, verified DOB. Per Raelyn Mora, CNM have patient go to MAU for evaluation of DVT. Pt is [redacted]w[redacted]d gestation. Patient is aware, currently at work, but I try to leave early. Spoke with Luna Kitchens, CNM that patient will arrive at MAU today for evaluation of DVT per Raelyn Mora, CNM.  Clovis Pu, RN

## 2021-11-29 NOTE — MAU Note (Signed)
Pt reports that yesterday she could feel "bumps' on both of her lower legs , the biggest in below her knee on the right leg. Mild discomfort w/ right leg only but it does not hurt to walk. Denies vaginal bleeding or abd pain.

## 2021-11-29 NOTE — MAU Provider Note (Signed)
History     CSN: [id number]  Arrival date and time: 11/29/21 1500    No chief complaint on file.  HPI 24yo G1P0 at [redacted]w[redacted]d. Seen with right ant leg pain that started earlier today. Pain nonradiating. Has a lump there and it feels like it's bruised, but does not recall injury. No bruising of skin. No pain in calf, no claudication.  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    Family History  Problem Relation Age of Onset   Hypertension Mother    Diabetes Paternal Grandmother    Hypertension Paternal Grandfather    Diabetes Paternal Grandfather     Social History   Tobacco Use   Smoking status: Never   Smokeless tobacco: Never  Vaping Use   Vaping Use: Never used  Substance Use Topics   Alcohol use: Not Currently    Comment: socially   Drug use: No    Allergies: No Known Allergies  Medications Prior to Admission  Medication Sig Dispense Refill Last Dose   Prenatal Vit-Fe Fumarate-FA (MULTIVITAMIN-PRENATAL) 27-0.8 MG TABS tablet Take 1 tablet by mouth daily at 12 noon.   11/28/2021   acetaminophen (TYLENOL) 500 MG tablet Take 500-1,000 mg by mouth every 6 (six) hours as needed for fever. (Patient not taking: Reported on 09/23/2021)      benzonatate (TESSALON) 100 MG capsule Take 1 capsule (100 mg total) by mouth 3 (three) times daily as needed for cough. (Patient not taking: Reported on 09/07/2021) 15 capsule 0    guaiFENesin (MUCINEX) 600 MG 12 hr tablet Take 1 tablet (600 mg total) by mouth 2 (two) times daily as needed. (Patient not taking: Reported on 09/07/2021) 20 tablet 0    levofloxacin (LEVAQUIN) 500 MG tablet Take 500 mg by mouth  daily. (Patient not taking: Reported on 09/07/2021)      Pseudoeph-Doxylamine-DM-APAP (DAYQUIL/NYQUIL COLD/FLU RELIEF PO) Take 1 capsule by mouth every 6 (six) hours as needed (for flu-like symptoms). (Patient not taking: Reported on 09/07/2021)      pseudoephedrine (SUDAFED) 30 MG tablet Take 30 mg by mouth every 4 (four) hours as needed for congestion. (Patient not taking: Reported on 09/07/2021)      TRI-SPRINTEC 0.18/0.215/0.25 MG-35 MCG tablet Take 1 tablet by mouth daily. (Patient not taking: Reported on 09/23/2021)       Review of Systems Physical Exam   Blood pressure 123/79, pulse 93, temperature 99.1 F (37.3 C), temperature source Oral, resp. rate 18, last menstrual period 06/19/2021, SpO2 98 %.  Physical Exam Vitals reviewed.  Constitutional:      Appearance: Normal appearance.  Cardiovascular:     Rate and Rhythm: Normal rate and regular rhythm.     Pulses: Normal pulses.     Heart sounds: Normal heart sounds.  Pulmonary:     Effort: Pulmonary effort is normal.  Breath sounds: Normal breath sounds.  Musculoskeletal:     Comments: No pain in posterior calf bilaterally. No erythema. Neg homans  Skin:    Capillary Refill: Capillary refill takes less than 2 seconds.  Neurological:     Mental Status: She is alert.  Psychiatric:        Mood and Affect: Mood normal.        Behavior: Behavior normal.        Thought Content: Thought content normal.        Judgment: Judgment normal.    MAU Course  Procedures NST:  Baseline: 140s  Variability: moderate Accelerations: ++  Decelerations: none Contractions: none   MDM Low risk for DVT.  Assessment and Plan   1. Supervision of normal first pregnancy, antepartum   2. [redacted] weeks gestation of pregnancy   3. Pain of right lower extremity    Low risk for DVT.  Heating pad, tylenol. Discharge to home.  Renee Ferrell 11/29/2021, 3:50 PM

## 2021-12-08 ENCOUNTER — Telehealth (INDEPENDENT_AMBULATORY_CARE_PROVIDER_SITE_OTHER): Payer: Self-pay

## 2021-12-08 VITALS — BP 128/80 | HR 92 | Wt 163.0 lb

## 2021-12-08 DIAGNOSIS — Z3400 Encounter for supervision of normal first pregnancy, unspecified trimester: Secondary | ICD-10-CM

## 2021-12-08 DIAGNOSIS — Z3A24 24 weeks gestation of pregnancy: Secondary | ICD-10-CM

## 2021-12-08 NOTE — Progress Notes (Signed)
° °  OBSTETRICS PRENATAL VIRTUAL VISIT ENCOUNTER NOTE  Provider location: [facility]   Patient location: Home  I connected with Renee Ferrell on 12/08/21 at  9:15 AM EST by MyChart Video Encounter and verified that I am speaking with the correct person using two identifiers. I discussed the limitations, risks, security and privacy concerns of performing an evaluation and management service virtually and the availability of in person appointments. I also discussed with the patient that there may be a patient responsible charge related to this service. The patient expressed understanding and agreed to proceed. Subjective:  Renee Ferrell is a 25 y.o. G1P0 at [redacted]w[redacted]d being seen today for ongoing prenatal care.  She is currently monitored for the following issues for this low-risk pregnancy and has Supervision of normal first pregnancy, antepartum on their problem list.  Patient reports no complaints.   Contractions: none. Vaginal bleeding: none .   Marland Kitchen Denies any leaking of fluid.   The following portions of the patient's history were reviewed and updated as appropriate: allergies, current medications, past family history, past medical history, past social history, past surgical history and problem list.   Objective:   Vitals:   12/08/21 0925  BP: 128/80  Pulse: 92  Weight: 163 lb (73.9 kg)    Fetal Status: Movement: present           General:  Alert, oriented and cooperative. Patient is in no acute distress.  Respiratory: Normal respiratory effort, no problems with respiration noted  Mental Status: Normal mood and affect. Normal behavior. Normal judgment and thought content.  Rest of physical exam deferred due to type of encounter  Imaging: No results found.  Assessment and Plan:  Pregnancy: G1P0 at [redacted]w[redacted]d 1. Supervision of normal first pregnancy, antepartum - Routine OB. Doing well - Endorses a lot of fetal movement, particularly at night - Reviewed [place] with patient  and significant other - Anticipatory guidance for upcoming appointments provided - GTT and 28 week labs at next visit  2. [redacted] weeks gestation of pregnancy    Preterm labor symptoms and general obstetric precautions including but not limited to vaginal bleeding, contractions, leaking of fluid and fetal movement were reviewed in detail with the patient. I discussed the assessment and treatment plan with the patient. The patient was provided an opportunity to ask questions and all were answered. The patient agreed with the plan and demonstrated an understanding of the instructions. The patient was advised to call back or seek an in-person office evaluation/go to MAU at [facility] for any urgent or concerning symptoms. Please refer to After Visit Summary for other counseling recommendations.   I provided 15 minutes of face-to-face time during this encounter.  Return in about 4 weeks (around 01/05/2022).  Future Appointments  Date Time Provider Department Center  01/06/2022  8:15 AM Raelyn Mora, CNM [facility] None    Brand Males, CNM [facility] [employer]

## 2021-12-26 NOTE — L&D Delivery Note (Addendum)
Delivery Note ?Renee Ferrell is a 26 y.o. G1P0 at [redacted]w[redacted]d admitted for IOL for postdates.  ? ?GBS Status: Negative/-- (03/13 0951) ?Maximum Maternal Temperature: 98.6 ? ?Labor course: Initial SVE: closed/thick/-3. Augmentation with: Pitocin and Cytotec. She then progressed to complete.  ?ROM: 1h 57m with clear fluid ? ?Birth: At [phone] a viable female was delivered via spontaneous vaginal delivery (Presentation: cephalic;LOA). Nuchal cord present: No.  Shoulders and body delivered in usual fashion. Infant placed directly on mom's abdomen for bonding/skin-to-skin, baby dried and stimulated. Cord clamped x 2 after 5 minutes and cut by FOB-Greg.  Cord blood collected.  The placenta separated spontaneously and delivered via gentle cord traction.  Pitocin infused rapidly IV per protocol.  Fundus firm with massage.  ?Placenta inspected and appears to be intact with a 3 VC.  Placenta/Cord with the following complications: none. Cord pH: n/a ?Sponge and instrument count were correct x2. ? ?Intrapartum complications:  None ?Anesthesia:  epidural ?Episiotomy: none ?Lacerations:  1st degree ?Suture Repair: 3.0 vicryl ?EBL (mL): 150 ? ? ?Infant: ?APGAR (1 MIN): 8   ?APGAR (5 MINS): 9   ?Infant weight: pending ? ?Mom to postpartum.  Baby to Couplet care / Skin to Skin. Placenta to L&D   ?Plans to Breastfeed ?Contraception: oral progesterone-only contraceptive ?Circumcision: N/A ? ?Note sent to [facility] for pp visit. ? ? ?Brand Males CNM ?04/04/2022 ?6:55 AM ? ? ? ?

## 2022-01-06 ENCOUNTER — Other Ambulatory Visit: Payer: Self-pay

## 2022-01-06 ENCOUNTER — Encounter: Payer: Self-pay | Admitting: Obstetrics and Gynecology

## 2022-01-06 ENCOUNTER — Ambulatory Visit (INDEPENDENT_AMBULATORY_CARE_PROVIDER_SITE_OTHER): Payer: Self-pay | Admitting: Obstetrics and Gynecology

## 2022-01-06 VITALS — BP 115/73 | HR 94 | Temp 98.0°F | Wt 171.8 lb

## 2022-01-06 DIAGNOSIS — Z3400 Encounter for supervision of normal first pregnancy, unspecified trimester: Secondary | ICD-10-CM

## 2022-01-06 DIAGNOSIS — Z3A28 28 weeks gestation of pregnancy: Secondary | ICD-10-CM

## 2022-01-06 NOTE — Progress Notes (Signed)
° °  LOW-RISK PREGNANCY OFFICE VISIT Patient name: Renee Ferrell MRN [id number]  Date of birth: 08/19/1996 Chief Complaint:   Routine Prenatal Visit  History of Present Illness:   Renee Ferrell is a 26 y.o. G1P0 female at [redacted]w[redacted]d with an Estimated Date of Delivery: 03/26/22 being seen today for ongoing management of a low-risk pregnancy.  Today she reports no complaints. She wants more explanation on the [place] results. She states that she as told everything was good, the weight was a percentile and she wanted to know more about what that meant. She also wanted to know if her baby could have both Medicaid and be on the [employer]. Contractions: Not present. Vag. Bleeding: None.  Movement: Present. denies leaking of fluid. Review of Systems:   Pertinent items are noted in HPI Denies abnormal vaginal discharge w/ itching/odor/irritation, headaches, visual changes, shortness of breath, chest pain, abdominal pain, severe nausea/vomiting, or problems with urination or bowel movements unless otherwise stated above. Pertinent History Reviewed:  Reviewed past medical,surgical, social, obstetrical and family history.  Reviewed problem list, medications and allergies. Physical Assessment:   Vitals:   01/06/22 0819  BP: 115/73  Pulse: 94  Temp: 98 F (36.7 C)  Weight: 171 lb 12.8 oz (77.9 kg)  Body mass index is 30.43 kg/m.        Physical Examination:   General appearance: Well appearing, and in no distress  Mental status: Alert, oriented to person, place, and time  Skin: Warm & dry  Cardiovascular: Normal heart rate noted  Respiratory: Normal respiratory effort, no distress  Abdomen: Soft, gravid, nontender  Pelvic: Cervical exam deferred         Extremities: Edema: None  Fetal Status: Fetal Heart Rate (bpm): 152 Fundal Height: 28 cm Movement: Present    No results found for this or any previous visit (from the past 24 hour(s)).  Assessment & Plan:  1) Low-risk pregnancy G1P0 at [redacted]w[redacted]d with an  Estimated Date of Delivery: 03/26/22   2) Supervision of normal first pregnancy, antepartum  - Glucose Tolerance, 2 Hours w/1 Hour,  - HIV Antibody (routine testing w rflx),  - RPR,  - CBC - Reviewed [place] results and further explained what EFW and growth percentile actually means.  - Reassurance given that she has had 17 lb TWG this pregnancy and FH is measuring on target - Information provided on FKC, third trimester pregnancy and iron rich diet   3) [redacted] weeks gestation of pregnancy    Meds: No orders of the defined types were placed in this encounter.  Labs/procedures today: 2 hr GTT, 3rd trimester labs  Plan:  Continue routine obstetrical care   Reviewed: Preterm labor symptoms and general obstetric precautions including but not limited to vaginal bleeding, contractions, leaking of fluid and fetal movement were reviewed in detail with the patient.  All questions were answered. Has home bp cuff. Check bp weekly, let us know if >140/90.   Follow-up: Return in about 4 weeks (around 02/03/2022) for Return OB visit.  Orders Placed This Encounter  Procedures   Glucose Tolerance, 2 Hours w/1 Hour   HIV Antibody (routine testing w rflx)   RPR   CBC   Raelyn Mora MSN, CNM 01/06/2022 8:36 AM

## 2022-01-06 NOTE — Patient Instructions (Signed)
Third Trimester of Pregnancy The third trimester of pregnancy is from week 28 through week 32. This is months 7 through 9. The third trimester is a time when the unborn baby (fetus) is growing rapidly. At the end of the ninth month, the fetus is about 20 inches long and weighs 6-10 pounds. Body changes during your third trimester During the third trimester, your body will continue to go through many changes. The changes vary and generally return to normal after your baby is born. Physical changes Your weight will continue to increase. You can expect to gain 25-35 pounds (11-16 kg) by the end of the pregnancy if you begin pregnancy at a normal weight. If you are underweight, you can expect to gain 28-40 lb (about 13-18 kg), and if you are overweight, you can expect to gain 15-25 lb (about 7-11 kg). You may begin to get stretch marks on your hips, abdomen, and breasts. Your breasts will continue to grow and may hurt. A yellow fluid (colostrum) may leak from your breasts. This is the first milk you are producing for your baby. You may have changes in your hair. These can include thickening of your hair, rapid growth, and changes in texture. Some people also have hair loss during or after pregnancy, or hair that feels dry or thin. Your belly button may stick out. You may notice more swelling in your hands, face, or ankles. Health changes You may have heartburn. You may have constipation. You may develop hemorrhoids. You may develop swollen, bulging veins (varicose veins) in your legs. You may have increased body aches in the pelvis, back, or thighs. This is due to weight gain and increased hormones that are relaxing your joints. You may have increased tingling or numbness in your hands, arms, and legs. The skin on your abdomen may also feel numb. You may feel short of breath because of your expanding uterus. Other changes You may urinate more often because the fetus is moving lower into your pelvis  and pressing on your bladder. You may have more problems sleeping. This may be caused by the size of your abdomen, an increased need to urinate, and an increase in your body's metabolism. You may notice the fetus "dropping," or moving lower in your abdomen (lightening). You may have increased vaginal discharge. You may notice that you have pain around your pelvic bone as your uterus distends. Follow these instructions at home: Medicines Follow your health care provider's instructions regarding medicine use. Specific medicines may be either safe or unsafe to take during pregnancy. Do not take any medicines unless approved by your health care provider. Take a prenatal vitamin that contains at least 600 micrograms (mcg) of folic acid. Eating and drinking Eat a healthy diet that includes fresh fruits and vegetables, whole grains, good sources of protein such as meat, eggs, or tofu, and low-fat dairy products. Avoid raw meat and unpasteurized juice, milk, and cheese. These carry germs that can harm you and your baby. Eat 4 or 5 small meals rather than 3 large meals a day. You may need to take these actions to prevent or treat constipation: Drink enough fluid to keep your urine pale yellow. Eat foods that are high in fiber, such as beans, whole grains, and fresh fruits and vegetables. Limit foods that are high in fat and processed sugars, such as fried or sweet foods. Activity Exercise only as directed by your health care provider. Most people can continue their usual exercise routine during pregnancy. Try to  exercise for 30 minutes at least 5 days a week. Stop exercising if you experience contractions in the uterus. °Stop exercising if you develop pain or cramping in the lower abdomen or lower back. °Avoid heavy lifting. °Do not exercise if it is very hot or humid or if you are at a high altitude. °If you choose to, you may continue to have sex unless your health care provider tells you not  to. °Relieving pain and discomfort °Take frequent breaks and rest with your legs raised (elevated) if you have leg cramps or low back pain. °Take warm sitz baths to soothe any pain or discomfort caused by hemorrhoids. Use hemorrhoid cream if your health care provider approves. °Wear a supportive bra to prevent discomfort from breast tenderness. °If you develop varicose veins: °Wear support hose as told by your health care provider. °Elevate your feet for 15 minutes, 3-4 times a day. °Limit salt in your diet. °Safety °Talk to your health care provider before traveling far distances. °Do not use hot tubs, steam rooms, or saunas. °Wear your seat belt at all times when driving or riding in a car. °Talk with your health care provider if someone is verbally or physically abusive to you. °Preparing for birth °To prepare for the arrival of your baby: °Take prenatal classes to understand, practice, and ask questions about labor and delivery. °Visit the hospital and tour the maternity area. °Purchase a rear-facing car seat and make sure you know how to install it in your car. °Prepare the baby's room or sleeping area. Make sure to remove all pillows and stuffed animals from the baby's crib to prevent suffocation. °General instructions °Avoid cat litter boxes and soil used by cats. These carry germs that can cause birth defects in the baby. If you have a cat, ask someone to clean the litter box for you. °Do not douche or use tampons. Do not use scented sanitary pads. °Do not use any products that contain nicotine or tobacco, such as cigarettes, e-cigarettes, and chewing tobacco. If you need help quitting, ask your health care provider. °Do not use any herbal remedies, illegal drugs, or medicines that were not prescribed to you. Chemicals in these products can harm your baby. °Do not drink alcohol. °You will have more frequent prenatal exams during the third trimester. During a routine prenatal visit, your health care provider  will do a physical exam, perform tests, and discuss your overall health. Keep all follow-up visits. This is important. °Where to find more information °American Pregnancy Association: americanpregnancy.org °American College of Obstetricians and Gynecologists: acog.org/en/Womens%20Health/Pregnancy °Office on Women's Health: womenshealth.gov/pregnancy °Contact a health care provider if you have: °A fever. °Mild pelvic cramps, pelvic pressure, or nagging pain in your abdominal area or lower back. °Vomiting or diarrhea. °Bad-smelling vaginal discharge or foul-smelling urine. °Pain when you urinate. °A headache that does not go away when you take medicine. °Visual changes or see spots in front of your eyes. °Get help right away if: °Your water breaks. °You have regular contractions less than 5 minutes apart. °You have spotting or bleeding from your vagina. °You have severe abdominal pain. °You have difficulty breathing. °You have chest pain. °You have fainting spells. °You have not felt your baby move for the time period told by your health care provider. °You have new or increased pain, swelling, or redness in an arm or leg. °Summary °The third trimester of pregnancy is from week 28 through week 40 (months 7 through 9). °You may have more problems sleeping.   This can be caused by the size of your abdomen, an increased need to urinate, and an increase in your body's metabolism. You will have more frequent prenatal exams during the third trimester. Keep all follow-up visits. This is important. This information is not intended to replace advice given to you by your health care provider. Make sure you discuss any questions you have with your health care provider. Document Revised: 05/20/2020 Document Reviewed: 03/26/2020 Elsevier Patient Education  2022 Elsevier Inc. Fetal Movement Counts Patient Name: ________________________________________________ Patient Due Date: ____________________ What is a fetal movement  count? A fetal movement count is the number of times that you feel your baby move during a certain amount of time. This may also be called a fetal kick count. A fetal movement count is recommended for every pregnant woman. You may be asked to start counting fetal movements as early as week 28 of your pregnancy. Pay attention to when your baby is most active. You may notice your baby's sleep and wake cycles. You may also notice things that make your baby move more. You should do a fetal movement count: When your baby is normally most active. At the same time each day. A good time to count movements is while you are resting, after having something to eat and drink. How do I count fetal movements? Find a quiet, comfortable area. Sit, or lie down on your side. Write down the date, the start time and stop time, and the number of movements that you felt between those two times. Take this information with you to your health care visits. Write down your start time when you feel the first movement. Count kicks, flutters, swishes, rolls, and jabs. You should feel at least 10 movements. You may stop counting after you have felt 10 movements, or if you have been counting for 2 hours. Write down the stop time. If you do not feel 10 movements in 2 hours, contact your health care provider for further instructions. Your health care provider may want to do additional tests to assess your baby's well-being. Contact a health care provider if: You feel fewer than 10 movements in 2 hours. Your baby is not moving like he or she usually does. Date: ____________ Start time: ____________ Stop time: ____________ Movements: ____________ Date: ____________ Start time: ____________ Stop time: ____________ Movements: ____________ Date: ____________ Start time: ____________ Stop time: ____________ Movements: ____________ Date: ____________ Start time: ____________ Stop time: ____________ Movements: ____________ Date:  ____________ Start time: ____________ Stop time: ____________ Movements: ____________ Date: ____________ Start time: ____________ Stop time: ____________ Movements: ____________ Date: ____________ Start time: ____________ Stop time: ____________ Movements: ____________ Date: ____________ Start time: ____________ Stop time: ____________ Movements: ____________ Date: ____________ Start time: ____________ Stop time: ____________ Movements: ____________ This information is not intended to replace advice given to you by your health care provider. Make sure you discuss any questions you have with your health care provider. Document Revised: 08/01/2019 Document Reviewed: 08/01/2019 Elsevier Patient Education  2022 Elsevier Inc. Iron-Rich Diet Iron is a mineral that helps your body produce hemoglobin. Hemoglobin is a protein in red blood cells that carries oxygen to your body's tissues. Eating too little iron may cause you to feel weak and tired, and it can increase your risk of infection. Iron is naturally found in many foods, and many foods have iron added to them (are iron-fortified). You may need to follow an iron-rich diet if you do not have enough iron in your body due to certain medical  conditions. The amount of iron that you need each day depends on your age, your sex, and any medical conditions you have. Follow instructions from your health care provider or a dietitian about how much iron you should eat each day. What are tips for following this plan? Reading food labels Check food labels to see how many milligrams (mg) of iron are in each serving. Cooking Cook foods in pots and pans that are made from iron. Take these steps to make it easier for your body to absorb iron from certain foods: Soak beans overnight before cooking. Soak whole grains overnight and drain them before using. Ferment flours before baking, such as by using yeast in bread dough. Meal planning When you eat foods that  contain iron, you should eat them with foods that are high in vitamin C. These include oranges, peppers, tomatoes, potatoes, and mangoes. Vitamin C helps your body absorb iron. Certain foods and drinks prevent your body from absorbing iron properly. Avoid eating these foods in the same meal as iron-rich foods or with iron supplements. These foods include: Coffee, black tea, and red wine. Milk, dairy products, and foods that are high in calcium. Beans and soybeans. Whole grains. General information Take iron supplements only as told by your health care provider. An overdose of iron can be life-threatening. If you were prescribed iron supplements, take them with orange juice or a vitamin C supplement. When you eat iron-fortified foods or take an iron supplement, you should also eat foods that naturally contain iron, such as meat, poultry, and fish. Eating naturally iron-rich foods helps your body absorb the iron that is added to other foods or contained in a supplement. Iron from animal sources is better absorbed than iron from plant sources. What foods should I eat? Fruits Prunes. Raisins. Eat fruits high in vitamin C, such as oranges, grapefruits, and strawberries, with iron-rich foods. Vegetables Spinach (cooked). Green peas. Broccoli. Fermented vegetables. Eat vegetables high in vitamin C, such as leafy greens, potatoes, bell peppers, and tomatoes, with iron-rich foods. Grains Iron-fortified breakfast cereal. Iron-fortified whole-wheat bread. Enriched rice. Sprouted grains. Meats and other proteins Beef liver. Beef. [place]. Chicken. Oysters. Shrimp. Tuna. Sardines. Chickpeas. Nuts. Tofu. Pumpkin seeds. Beverages Tomato juice. Fresh orange juice. Prune juice. Hibiscus tea. Iron-fortified instant breakfast shakes. Sweets and desserts Blackstrap molasses. Seasonings and condiments Tahini. Fermented soy sauce. Other foods Wheat germ. The items listed above may not be a complete list of  recommended foods and beverages. Contact a dietitian for more information. What foods should I limit? These are foods that should be limited while eating iron-rich foods as they can reduce the absorption of iron in your body. Grains Whole grains. Bran cereal. Bran flour. Meats and other proteins Soybeans. Products made from soy protein. Black beans. Lentils. Mung beans. Split peas. Dairy Milk. Cream. Cheese. Yogurt. Cottage cheese. Beverages Coffee. Black tea. Red wine. Sweets and desserts Cocoa. Chocolate. Ice cream. Seasonings and condiments Basil. Oregano. Large amounts of parsley. The items listed above may not be a complete list of foods and beverages you should limit. Contact a dietitian for more information. Summary Iron is a mineral that helps your body produce hemoglobin. Hemoglobin is a protein in red blood cells that carries oxygen to your body's tissues. Iron is naturally found in many foods, and many foods have iron added to them (are iron-fortified). When you eat foods that contain iron, you should eat them with foods that are high in vitamin C. Vitamin C helps your  body absorb iron. Certain foods and drinks prevent your body from absorbing iron properly, such as whole grains and dairy products. You should avoid eating these foods in the same meal as iron-rich foods or with iron supplements. This information is not intended to replace advice given to you by your health care provider. Make sure you discuss any questions you have with your health care provider. Document Revised: 11/23/2020 Document Reviewed: 11/23/2020 Elsevier Patient Education  2022 [employer]

## 2022-01-07 LAB — CBC
Hematocrit: 37.2 % (ref 34.0–46.6)
Hemoglobin: 12.6 g/dL (ref 11.1–15.9)
MCH: 31.5 pg (ref 26.6–33.0)
MCHC: 33.9 g/dL (ref 31.5–35.7)
MCV: 93 fL (ref 79–97)
Platelets: 196 10*3/uL (ref 150–450)
RBC: 4 x10E6/uL (ref 3.77–5.28)
RDW: 12.4 % (ref 11.7–15.4)
WBC: 10.8 10*3/uL (ref 3.4–10.8)

## 2022-01-07 LAB — GLUCOSE TOLERANCE, 2 HOURS W/ 1HR
Glucose, 1 hour: 127 mg/dL (ref 70–179)
Glucose, 2 hour: 96 mg/dL (ref 70–152)
Glucose, Fasting: 90 mg/dL (ref 70–91)

## 2022-01-07 LAB — RPR: RPR Ser Ql: NONREACTIVE

## 2022-01-07 LAB — HIV ANTIBODY (ROUTINE TESTING W REFLEX): HIV Screen 4th Generation wRfx: NONREACTIVE

## 2022-02-02 ENCOUNTER — Telehealth (INDEPENDENT_AMBULATORY_CARE_PROVIDER_SITE_OTHER): Payer: Self-pay

## 2022-02-02 ENCOUNTER — Other Ambulatory Visit: Payer: Self-pay

## 2022-02-02 VITALS — BP 128/80 | HR 93 | Wt 172.0 lb

## 2022-02-02 DIAGNOSIS — Z3400 Encounter for supervision of normal first pregnancy, unspecified trimester: Secondary | ICD-10-CM

## 2022-02-02 DIAGNOSIS — Z3A32 32 weeks gestation of pregnancy: Secondary | ICD-10-CM

## 2022-02-02 DIAGNOSIS — Z3403 Encounter for supervision of normal first pregnancy, third trimester: Secondary | ICD-10-CM

## 2022-02-02 NOTE — Progress Notes (Signed)
° °  OBSTETRICS PRENATAL VIRTUAL VISIT ENCOUNTER NOTE  Provider location: [facility]   Patient location: Home  I, Gavin Pound, CNM, connected with Valicia Prinzo on 02/02/22 at  8:35 AM EST by MyChart Video Encounter and verified that I am speaking with the correct person using two identifiers. I discussed the limitations, risks, security and privacy concerns of performing an evaluation and management service virtually and the availability of in person appointments. I also discussed with the patient that there may be a patient responsible charge related to this service. The patient expressed understanding and agreed to proceed. Subjective:  Renee Ferrell is a 26 y.o. G1P0 at [redacted]w[redacted]d being seen today for ongoing prenatal care.  She is currently monitored for the following issues for this low-risk pregnancy and has Supervision of normal first pregnancy, antepartum on their problem list.  Patient reports no complaints.  She endorses fetal movement and denies vaginal concerns.  She also denies abdominal cramping or contractions as well as N/V or constipation/diarrhea.  She reports "everything has been great."  Patient questions when the frequency of her visits will increase. .  .   . Denies any leaking of fluid.   The following portions of the patient's history were reviewed and updated as appropriate: allergies, current medications, past family history, past medical history, past social history, past surgical history and problem list.   Objective:   Vitals:   02/02/22 0843  BP: 128/80  Pulse: 93  Weight: 172 lb (78 kg)    Fetal Status:           General:  Alert, oriented and cooperative. Patient is in no acute distress.  Respiratory: Normal respiratory effort, no problems with respiration noted  Mental Status: Normal mood and affect. Normal behavior. Normal judgment and thought content.  Rest of physical exam deferred due to type of encounter  Imaging: No results  found.  Assessment and Plan:  Pregnancy: G1P0 at [redacted]w[redacted]d 1. Supervision of normal first pregnancy, antepartum -Anticipatory guidance for upcoming appts. -Patient to schedule next appt in 4 weeks for an in-person visit. -Educated on GBS bacteria including what it is, why we test, and how and when we treat if needed. -Informed that GC/CT testing will also be repeated.   2. [redacted] weeks gestation of pregnancy -Doing well. -Discussed prenatal schedule for LR patients. -Reviewed after hours and emergent care procedures.   Preterm labor symptoms and general obstetric precautions including but not limited to vaginal bleeding, contractions, leaking of fluid and fetal movement were reviewed in detail with the patient. I discussed the assessment and treatment plan with the patient. The patient was provided an opportunity to ask questions and all were answered. The patient agreed with the plan and demonstrated an understanding of the instructions. The patient was advised to call back or seek an in-person office evaluation/go to [facility] at [facility] for any urgent or concerning symptoms. Please refer to After Visit Summary for other counseling recommendations.   I provided 4 minutes of face-to-face time during this encounter.  No follow-ups on file.  Future Appointments  Date Time Provider [place]  03/03/2022  9:15 AM Laury Deep, CNM [facility] None  03/16/2022  9:15 AM Laury Deep, CNM [facility] None  03/23/2022 10:15 AM Gabriel Carina, [place], [place] for [employer], [place]

## 2022-02-03 ENCOUNTER — Encounter: Payer: Self-pay | Admitting: Obstetrics and Gynecology

## 2022-02-18 ENCOUNTER — Other Ambulatory Visit: Payer: Self-pay

## 2022-02-18 ENCOUNTER — Ambulatory Visit (INDEPENDENT_AMBULATORY_CARE_PROVIDER_SITE_OTHER): Payer: Self-pay | Admitting: Certified Nurse Midwife

## 2022-02-18 VITALS — BP 133/80 | HR 99 | Wt 178.8 lb

## 2022-02-18 DIAGNOSIS — Z3493 Encounter for supervision of normal pregnancy, unspecified, third trimester: Secondary | ICD-10-CM

## 2022-02-18 DIAGNOSIS — Z3A34 34 weeks gestation of pregnancy: Secondary | ICD-10-CM

## 2022-02-18 NOTE — Progress Notes (Signed)
° °  PRENATAL VISIT NOTE  Subjective:  Renee Ferrell is a 26 y.o. G1P0 at [redacted]w[redacted]d being seen today for ongoing prenatal care.  She is currently monitored for the following issues for this low-risk pregnancy and has Supervision of normal first pregnancy, antepartum on their problem list.  Patient reports no complaints.  Contractions: Irritability. Vag. Bleeding: None.  Movement: Present. Denies leaking of fluid.   The following portions of the patient's history were reviewed and updated as appropriate: allergies, current medications, past family history, past medical history, past social history, past surgical history and problem list.   Objective:   Vitals:   02/18/22 1058  BP: 133/80  Pulse: 99  Weight: 178 lb 12.8 oz (81.1 kg)    Fetal Status: Fetal Heart Rate (bpm): 145 Fundal Height: 34 cm Movement: Present  Presentation: Vertex  General:  Alert, oriented and cooperative. Patient is in no acute distress.  Skin: Skin is warm and dry. No rash noted.   Cardiovascular: Normal heart rate noted  Respiratory: Normal respiratory effort, no problems with respiration noted  Abdomen: Soft, gravid, appropriate for gestational age.  Pain/Pressure: Present     Pelvic: Cervical exam deferred        Extremities: Normal range of motion.  Edema: Trace  Mental Status: Normal mood and affect. Normal behavior. Normal judgment and thought content.   Assessment and Plan:  Pregnancy: G1P0 at [redacted]w[redacted]d 1. Supervision of low-risk pregnancy, third trimester - Doing well, feeling regular and vigorous fetal movement   2. [redacted] weeks gestation of pregnancy - Routine OB care including anticipatory guidance re GBS testing at next visit. - Reviewed standard hospital procedures for L&D/postpartum and visiting restrictions - Pt beginning to develop birth plan, gave general guidance  Preterm labor symptoms and general obstetric precautions including but not limited to vaginal bleeding, contractions, leaking of fluid and  fetal movement were reviewed in detail with the patient. Please refer to After Visit Summary for other counseling recommendations.   Return in about 2 weeks (around 03/04/2022) for IN-PERSON, LOB/GBS.  Future Appointments  Date Time Provider Department Center  03/07/2022  8:55 AM Constant, Gigi Gin, MD [facility] None    Bernerd Limbo, CNM

## 2022-03-03 ENCOUNTER — Encounter: Payer: Self-pay | Admitting: Certified Nurse Midwife

## 2022-03-03 ENCOUNTER — Encounter: Payer: Self-pay | Admitting: Obstetrics and Gynecology

## 2022-03-07 ENCOUNTER — Ambulatory Visit (INDEPENDENT_AMBULATORY_CARE_PROVIDER_SITE_OTHER): Payer: Self-pay | Admitting: Obstetrics and Gynecology

## 2022-03-07 ENCOUNTER — Encounter: Payer: Self-pay | Admitting: Obstetrics and Gynecology

## 2022-03-07 ENCOUNTER — Other Ambulatory Visit (HOSPITAL_COMMUNITY)
Admission: RE | Admit: 2022-03-07 | Discharge: 2022-03-07 | Disposition: A | Discharge status: Home / Self Care | Payer: Self-pay | Source: Ambulatory Visit | Attending: Obstetrics and Gynecology | Admitting: Obstetrics and Gynecology

## 2022-03-07 ENCOUNTER — Other Ambulatory Visit: Payer: Self-pay

## 2022-03-07 VITALS — BP 125/81 | HR 90 | Wt 186.4 lb

## 2022-03-07 DIAGNOSIS — Z3400 Encounter for supervision of normal first pregnancy, unspecified trimester: Secondary | ICD-10-CM

## 2022-03-07 NOTE — Progress Notes (Signed)
Pt presents for ROB. She complains of a "stabbing" feeling in her vaginal area.  ?

## 2022-03-07 NOTE — Progress Notes (Signed)
? ?  PRENATAL VISIT NOTE ? ?Subjective:  ?Renee Ferrell is a 26 y.o. G1P0 at [redacted]w[redacted]d being seen today for ongoing prenatal care.  She is currently monitored for the following issues for this low-risk pregnancy and has Supervision of normal first pregnancy, antepartum on their problem list. ? ?Patient reports no complaints.  Contractions: Irritability. Vag. Bleeding: None.  Movement: Present. Denies leaking of fluid.  ? ?The following portions of the patient's history were reviewed and updated as appropriate: allergies, current medications, past family history, past medical history, past social history, past surgical history and problem list.  ? ?Objective:  ? ?Vitals:  ? 03/07/22 0907  ?BP: 125/81  ?Pulse: 90  ?Weight: 186 lb 6.4 oz (84.6 kg)  ? ? ?Fetal Status: Fetal Heart Rate (bpm): 135 Fundal Height: 37 cm Movement: Present  Presentation: Vertex ? ?General:  Alert, oriented and cooperative. Patient is in no acute distress.  ?Skin: Skin is warm and dry. No rash noted.   ?Cardiovascular: Normal heart rate noted  ?Respiratory: Normal respiratory effort, no problems with respiration noted  ?Abdomen: Soft, gravid, appropriate for gestational age.  Pain/Pressure: Absent     ?Pelvic: Cervical exam performed in the presence of a chaperone Dilation: Closed Effacement (%): Thick Station: -3  ?Extremities: Normal range of motion.  Edema: Trace  ?Mental Status: Normal mood and affect. Normal behavior. Normal judgment and thought content.  ? ?Assessment and Plan:  ?Pregnancy: G1P0 at [redacted]w[redacted]d ?1. Supervision of normal first pregnancy, antepartum ?Patient is doing well without complaints ?Cultures collected ?Patient decided on a pediatrician in [place] ? ?Term labor symptoms and general obstetric precautions including but not limited to vaginal bleeding, contractions, leaking of fluid and fetal movement were reviewed in detail with the patient. ?Please refer to After Visit Summary for other counseling recommendations.  ? ?Return in about 1  week (around 03/14/2022) for in person, ROB, Low risk. ? ?No future appointments. ? ?Catalina Antigua, MD ? ?

## 2022-03-08 LAB — CERVICOVAGINAL ANCILLARY ONLY
Chlamydia: NEGATIVE
Comment: NEGATIVE
Comment: NORMAL
Neisseria Gonorrhea: NEGATIVE

## 2022-03-09 LAB — STREP GP B NAA: Strep Gp B NAA: NEGATIVE

## 2022-03-11 ENCOUNTER — Encounter: Payer: Self-pay | Admitting: Obstetrics and Gynecology

## 2022-03-14 ENCOUNTER — Encounter: Payer: Self-pay | Admitting: Obstetrics and Gynecology

## 2022-03-14 ENCOUNTER — Ambulatory Visit (INDEPENDENT_AMBULATORY_CARE_PROVIDER_SITE_OTHER): Payer: Self-pay | Admitting: Obstetrics and Gynecology

## 2022-03-14 ENCOUNTER — Other Ambulatory Visit: Payer: Self-pay

## 2022-03-14 VITALS — BP 136/88 | HR 97 | Wt 191.0 lb

## 2022-03-14 DIAGNOSIS — Z3400 Encounter for supervision of normal first pregnancy, unspecified trimester: Secondary | ICD-10-CM

## 2022-03-14 MED ORDER — PANTOPRAZOLE SODIUM 40 MG PO TBEC
40.0000 mg | DELAYED_RELEASE_TABLET | Freq: Every day | ORAL | 0 refills | Status: DC
Start: 2022-03-14 — End: 2022-04-05

## 2022-03-14 NOTE — Progress Notes (Signed)
? ?  PRENATAL VISIT NOTE ? ?Subjective:  ?Renee Ferrell is a 26 y.o. G1P0 at [redacted]w[redacted]d being seen today for ongoing prenatal care.  She is currently monitored for the following issues for this low-risk pregnancy and has Supervision of normal first pregnancy, antepartum on their problem list. ? ?Patient reports no complaints.  Contractions: Irregular. Vag. Bleeding: None.  Movement: Present. Denies leaking of fluid.  ? ?The following portions of the patient's history were reviewed and updated as appropriate: allergies, current medications, past family history, past medical history, past social history, past surgical history and problem list.  ? ?Objective:  ? ?Vitals:  ? 03/14/22 1354  ?BP: 136/88  ?Pulse: 97  ?Weight: 191 lb (86.6 kg)  ? ? ?Fetal Status: Fetal Heart Rate (bpm): 140 Fundal Height: 38 cm Movement: Present    ? ?General:  Alert, oriented and cooperative. Patient is in no acute distress.  ?Skin: Skin is warm and dry. No rash noted.   ?Cardiovascular: Normal heart rate noted  ?Respiratory: Normal respiratory effort, no problems with respiration noted  ?Abdomen: Soft, gravid, appropriate for gestational age.  Pain/Pressure: Present     ?Pelvic: Cervical exam deferred        ?Extremities: Normal range of motion.  Edema: Trace  ?Mental Status: Normal mood and affect. Normal behavior. Normal judgment and thought content.  ? ?Assessment and Plan:  ?Pregnancy: G1P0 at [redacted]w[redacted]d ?1. Supervision of normal first pregnancy, antepartum ?Patient is doing well without complaints ?Discussed plan for IOL at 41 weeks pending no maternal/fetal complications ?Initial FHR 180's. NST reviewed and reactive with baseline 140, mod variability, +accels, no decels ? ?Term labor symptoms and general obstetric precautions including but not limited to vaginal bleeding, contractions, leaking of fluid and fetal movement were reviewed in detail with the patient. ?Please refer to After Visit Summary for other counseling recommendations.  ? ?Return in  about 1 week (around 03/21/2022) for in person, ROB, Low risk. ? ?No future appointments. ? ?Catalina Antigua, MD ? ?

## 2022-03-14 NOTE — Addendum Note (Signed)
Addended by: Marya Landry D on: 03/14/2022 02:26 PM ? ? Modules accepted: Orders ? ?

## 2022-03-14 NOTE — Progress Notes (Signed)
Pt is concerned about weight gain.  ?Pt complains of heartburn, using Tums. ? ?

## 2022-03-16 ENCOUNTER — Encounter: Payer: Self-pay | Admitting: Obstetrics and Gynecology

## 2022-03-18 ENCOUNTER — Inpatient Hospital Stay (HOSPITAL_COMMUNITY)
Admission: AD | Admit: 2022-03-18 | Discharge: 2022-03-18 | Disposition: A | Discharge status: Home / Self Care | Payer: Self-pay | Attending: Obstetrics and Gynecology | Admitting: Obstetrics and Gynecology

## 2022-03-18 ENCOUNTER — Encounter (HOSPITAL_COMMUNITY): Payer: Self-pay | Admitting: Obstetrics and Gynecology

## 2022-03-18 ENCOUNTER — Other Ambulatory Visit: Payer: Self-pay

## 2022-03-18 ENCOUNTER — Encounter: Payer: Self-pay | Admitting: Obstetrics and Gynecology

## 2022-03-18 DIAGNOSIS — Z3400 Encounter for supervision of normal first pregnancy, unspecified trimester: Secondary | ICD-10-CM

## 2022-03-18 DIAGNOSIS — Z3A38 38 weeks gestation of pregnancy: Secondary | ICD-10-CM | POA: Insufficient documentation

## 2022-03-18 DIAGNOSIS — Z3689 Encounter for other specified antenatal screening: Secondary | ICD-10-CM

## 2022-03-18 DIAGNOSIS — Z3403 Encounter for supervision of normal first pregnancy, third trimester: Secondary | ICD-10-CM

## 2022-03-18 DIAGNOSIS — O26893 Other specified pregnancy related conditions, third trimester: Secondary | ICD-10-CM | POA: Insufficient documentation

## 2022-03-18 DIAGNOSIS — O169 Unspecified maternal hypertension, unspecified trimester: Secondary | ICD-10-CM

## 2022-03-18 DIAGNOSIS — R030 Elevated blood-pressure reading, without diagnosis of hypertension: Secondary | ICD-10-CM | POA: Insufficient documentation

## 2022-03-18 LAB — CBC
HCT: 32.7 % — ABNORMAL LOW (ref 36.0–46.0)
Hemoglobin: 11.8 g/dL — ABNORMAL LOW (ref 12.0–15.0)
MCH: 32.2 pg (ref 26.0–34.0)
MCHC: 36.1 g/dL — ABNORMAL HIGH (ref 30.0–36.0)
MCV: 89.3 fL (ref 80.0–100.0)
Platelets: 162 10*3/uL (ref 150–400)
RBC: 3.66 MIL/uL — ABNORMAL LOW (ref 3.87–5.11)
RDW: 14.1 % (ref 11.5–15.5)
WBC: 11.5 10*3/uL — ABNORMAL HIGH (ref 4.0–10.5)
nRBC: 0 % (ref 0.0–0.2)

## 2022-03-18 LAB — COMPREHENSIVE METABOLIC PANEL
ALT: 18 U/L (ref 0–44)
AST: 17 U/L (ref 15–41)
Albumin: 2.6 g/dL — ABNORMAL LOW (ref 3.5–5.0)
Alkaline Phosphatase: 138 U/L — ABNORMAL HIGH (ref 38–126)
Anion gap: 3 — ABNORMAL LOW (ref 5–15)
BUN: 7 mg/dL (ref 6–20)
CO2: 21 mmol/L — ABNORMAL LOW (ref 22–32)
Calcium: 8.6 mg/dL — ABNORMAL LOW (ref 8.9–10.3)
Chloride: 109 mmol/L (ref 98–111)
Creatinine, Ser: 0.56 mg/dL (ref 0.44–1.00)
GFR, Estimated: >60 mL/min (ref >60)
Glucose, Bld: 97 mg/dL (ref 70–99)
Potassium: 3.8 mmol/L (ref 3.5–5.1)
Sodium: 133 mmol/L — ABNORMAL LOW (ref 135–145)
Total Bilirubin: 0.3 mg/dL (ref 0.3–1.2)
Total Protein: 5.3 g/dL — ABNORMAL LOW (ref 6.5–8.1)

## 2022-03-18 LAB — PROTEIN / CREATININE RATIO, URINE
Creatinine, Urine: 35.38 mg/dL
Total Protein, Urine: <6 mg/dL

## 2022-03-18 NOTE — MAU Provider Note (Signed)
?History  ?  ? ?CSN: [id number] ? ?Arrival date and time: 03/18/22 1955 ? ? Event Date/Time  ? First Provider Initiated Contact with Patient 03/18/22 2051   ?  ? ?Chief Complaint  ?Patient presents with  ? Hypertension  ? ?Renee Ferrell is a 26 y.o. G1P0 at [redacted]w[redacted]d who receives care at [facility].  She presents today for elevated BP.She states her bp td was 133/90 with a HA of 2/10 that resolved without intervention. She also reports BP of 130/80 and HA of 4/10 yesterday that resolved without meds.  She denies current HA, visual disturances, chest pain, or SOB.  She endorses fetal movement and reports some BH Ctx that she states are not painful.  ? ? ? ? ?OB History   ? ? Gravida  ?1  ? Para  ?   ? Term  ?   ? Preterm  ?   ? AB  ?   ? Living  ?   ?  ? ? SAB  ?   ? IAB  ?   ? Ectopic  ?   ? Multiple  ?   ? Live Births  ?   ?   ?  ?  ? ? ?Past Medical History:  ?Diagnosis Date  ? Renal disorder   ? hydronephrosis L  ? ? ?Past Surgical History:  ?Procedure Laterality Date  ? KIDNEY SURGERY Left 2013  ? with stent placement and removal  ? ? ?Family History  ?Problem Relation Age of Onset  ? Hypertension Mother   ? Diabetes Paternal Grandmother   ? Hypertension Paternal Grandfather   ? Diabetes Paternal Grandfather   ? ? ?Social History  ? ?Tobacco Use  ? Smoking status: Never  ? Smokeless tobacco: Never  ?Vaping Use  ? Vaping Use: Never used  ?Substance Use Topics  ? Alcohol use: Not Currently  ?  Comment: socially  ? Drug use: No  ? ? ?Allergies: No Known Allergies ? ?Medications Prior to Admission  ?Medication Sig Dispense Refill Last Dose  ? Prenatal Vit-Fe Fumarate-FA (MULTIVITAMIN-PRENATAL) 27-0.8 MG TABS tablet Take 1 tablet by mouth daily at 12 noon.   03/17/2022  ? pantoprazole (PROTONIX) 40 MG tablet Take 1 tablet (40 mg total) by mouth daily. 30 tablet 0   ? ? ?Review of Systems  ?Eyes:  Negative for photophobia and visual disturbance.  ?Respiratory:  Negative for shortness of breath.   ?Cardiovascular:  Negative for  chest pain.  ?Gastrointestinal:  Negative for abdominal pain.  ?Genitourinary:  Negative for vaginal bleeding and vaginal discharge.  ?Neurological:  Negative for dizziness, light-headedness and headaches.  ?Physical Exam  ? ?Blood pressure 138/81, pulse 91, temperature 98.1 ?F (36.7 ?C), temperature source Oral, resp. rate 20, height 5\' 3"  (1.6 m), weight 87.7 kg, last menstrual period 06/19/2021, SpO2 97 %. ?Vitals:  ? 03/18/22 2009 03/18/22 2029 03/18/22 2030 03/18/22 2031  ?BP: 127/74 (!) 155/87  138/81  ?Pulse: 91 92  91  ?Resp: 20     ?Temp: 98.1 ?F (36.7 ?C)     ?TempSrc: Oral     ?SpO2: 100%  97%   ?Weight: 87.7 kg     ?Height: 5\' 3"  (1.6 m)     ? ? ?Physical Exam ?Vitals reviewed. Exam conducted with a chaperone present.  ?Constitutional:   ?   Appearance: Normal appearance.  ?HENT:  ?   Head: Normocephalic and atraumatic.  ?Eyes:  ?   Conjunctiva/sclera: Conjunctivae normal.  ?Cardiovascular:  ?  Rate and Rhythm: Normal rate and regular rhythm.  ?   Heart sounds: Normal heart sounds.  ?Pulmonary:  ?   Effort: Pulmonary effort is normal. No respiratory distress.  ?   Breath sounds: Normal breath sounds.  ?Abdominal:  ?   General: Bowel sounds are normal.  ?Musculoskeletal:     ?   General: Normal range of motion.  ?   Cervical back: Normal range of motion.  ?Skin: ?   General: Skin is dry.  ?Neurological:  ?   Mental Status: She is alert and oriented to person, place, and time.  ?Psychiatric:     ?   Mood and Affect: Mood normal.     ?   Behavior: Behavior normal.     ?   Thought Content: Thought content normal.  ? ? ?Fetal Assessment ?130 bpm, Mod Var, -Decels, +15x15 Accels ?Toco: Qmin ? ?MAU Course  ? ?Results for orders placed or performed during the hospital encounter of 03/18/22 (from the past 24 hour(s))  ?CBC     Status: Abnormal  ? Collection Time: 03/18/22  8:52 PM  ?Result Value Ref Range  ? WBC 11.5 (H) 4.0 - 10.5 K/uL  ? RBC 3.66 (L) 3.87 - 5.11 MIL/uL  ? Hemoglobin 11.8 (L) 12.0 - 15.0 g/dL   ? HCT 32.7 (L) 36.0 - 46.0 %  ? MCV 89.3 80.0 - 100.0 fL  ? MCH 32.2 26.0 - 34.0 pg  ? MCHC 36.1 (H) 30.0 - 36.0 g/dL  ? RDW 14.1 11.5 - 15.5 %  ? Platelets 162 150 - 400 K/uL  ? nRBC 0.0 0.0 - 0.2 %  ?Comprehensive metabolic panel     Status: Abnormal  ? Collection Time: 03/18/22  8:52 PM  ?Result Value Ref Range  ? Sodium 133 (L) 135 - 145 mmol/L  ? Potassium 3.8 3.5 - 5.1 mmol/L  ? Chloride 109 98 - 111 mmol/L  ? CO2 21 (L) 22 - 32 mmol/L  ? Glucose, Bld 97 70 - 99 mg/dL  ? BUN 7 6 - 20 mg/dL  ? Creatinine, Ser 0.56 0.44 - 1.00 mg/dL  ? Calcium 8.6 (L) 8.9 - 10.3 mg/dL  ? Total Protein 5.3 (L) 6.5 - 8.1 g/dL  ? Albumin 2.6 (L) 3.5 - 5.0 g/dL  ? AST 17 15 - 41 U/L  ? ALT 18 0 - 44 U/L  ? Alkaline Phosphatase 138 (H) 38 - 126 U/L  ? Total Bilirubin 0.3 0.3 - 1.2 mg/dL  ? GFR, Estimated >60 >60 mL/min  ? Anion gap 3 (L) 5 - 15  ?Protein / creatinine ratio, urine     Status: None  ? Collection Time: 03/18/22  9:36 PM  ?Result Value Ref Range  ? Creatinine, Urine 35.38 mg/dL  ? Total Protein, Urine <6 mg/dL  ? Protein Creatinine Ratio        0.00 - 0.15 mg/mg[Cre]  ? ?No results found. ? ?MDM ?Physical Exam ?Labs: CBC, CMP, PC Ratio ?Measure BPQ15 min ?EFM ?Assessment and Plan  ?26 year old G1P0  ?SIUP at 38.6 weeks ?Cat I FT ?Elevated BP ? ?-POC Reviewed ?-Exam performed ?-Informed of one elevated blood pressure, but will continue to monitor. ?-Discussed that if  severe blood pressures arise or abnormal labs return will admit for IOL. ?-Patient instructed to remain NPO currently. ?-NST Reactive. ? ?Cherre RobinsJessica L Seneca Gadbois MSN, CNM ?03/18/2022, 8:51 PM  ? ?Reassessment (11:18 PM) ? ?Results reviewed by Dr. Jaymes GraffP. Duncan on unit.  ?Plan to discharge  and return to MAU for BP check tomorrow. ?If elevated will induce for gHTN. ? ?Cherre Robins MSN, CNM ?Advanced Practice Provider, [facility] ?

## 2022-03-18 NOTE — MAU Note (Signed)
PT SAYS SHE TOOK BP LAST NIGHT -130/80-HAD H/A YESTERDAY- NO MEDS- 4 ? ?TODAY -HAD MILD H/A AT NOON-  2 -WENT AWAY ?NO H/A NOW - TOOK BP AT 5 PM- 133/90-RANDOMLY TOOK ?(ON Monday - WENT TO DR- NST- FHR- 180'S) ?CALLED [facility] AT 6PM- TOLD TO COME IN  ?NOW- NO H/A , NO BLURRED VISION, NO EPIGASTRIC ?SHE FEELS SOME UC'S - NO PAIN ?SAYS BP'S IN OFFIC E- GOOD  ? ?

## 2022-03-22 ENCOUNTER — Other Ambulatory Visit: Payer: Self-pay

## 2022-03-22 ENCOUNTER — Ambulatory Visit (INDEPENDENT_AMBULATORY_CARE_PROVIDER_SITE_OTHER): Payer: Self-pay | Admitting: Obstetrics & Gynecology

## 2022-03-22 VITALS — BP 127/86 | HR 97 | Wt 190.0 lb

## 2022-03-22 DIAGNOSIS — Z3400 Encounter for supervision of normal first pregnancy, unspecified trimester: Secondary | ICD-10-CM

## 2022-03-22 NOTE — Progress Notes (Signed)
? ?  PRENATAL VISIT NOTE ? ?Subjective:  ?Renee Ferrell is a 26 y.o. G1P0 at [redacted]w[redacted]d being seen today for ongoing prenatal care.  She is currently monitored for the following issues for this low-risk pregnancy and has Supervision of normal first pregnancy, antepartum on their problem list. ? ?Patient reports no complaints.  Contractions: Irritability. Vag. Bleeding: None.  Movement: Present. Denies leaking of fluid.  ? ?The following portions of the patient's history were reviewed and updated as appropriate: allergies, current medications, past family history, past medical history, past social history, past surgical history and problem list.  ? ?Objective:  ? ?Vitals:  ? 03/22/22 1355  ?BP: 127/86  ?Pulse: 97  ?Weight: 190 lb (86.2 kg)  ? ? ?Fetal Status: Fetal Heart Rate (bpm): 138   Movement: Present  Presentation: Vertex ? ?General:  Alert, oriented and cooperative. Patient is in no acute distress.  ?Skin: Skin is warm and dry. No rash noted.   ?Cardiovascular: Normal heart rate noted  ?Respiratory: Normal respiratory effort, no problems with respiration noted  ?Abdomen: Soft, gravid, appropriate for gestational age.  Pain/Pressure: Present     ?Pelvic: Cervical exam performed in the presence of a chaperone Dilation: Closed Effacement (%): 30    ?Extremities: Normal range of motion.  Edema: Trace  ?Mental Status: Normal mood and affect. Normal behavior. Normal judgment and thought content.  ? ?Assessment and Plan:  ?Pregnancy: G1P0 at [redacted]w[redacted]d ?There are no diagnoses linked to this encounter. ?Term labor symptoms and general obstetric precautions including but not limited to vaginal bleeding, contractions, leaking of fluid and fetal movement were reviewed in detail with the patient. ?Please refer to After Visit Summary for other counseling recommendations.  ? ?Return in about 1 week (around 03/29/2022). ? ?Future Appointments  ?Date Time Provider [place]  ?03/30/2022  1:50 PM Shelly Bombard, MD [place] None   ? ? ?Emeterio Reeve, MD ?

## 2022-03-23 ENCOUNTER — Encounter: Payer: Self-pay | Admitting: Certified Nurse Midwife

## 2022-03-24 ENCOUNTER — Telehealth (HOSPITAL_COMMUNITY): Payer: Self-pay | Admitting: *Deleted

## 2022-03-24 ENCOUNTER — Encounter (HOSPITAL_COMMUNITY): Payer: Self-pay | Admitting: *Deleted

## 2022-03-24 NOTE — Telephone Encounter (Signed)
Preadmission screen  

## 2022-03-30 ENCOUNTER — Ambulatory Visit (INDEPENDENT_AMBULATORY_CARE_PROVIDER_SITE_OTHER): Payer: Self-pay | Admitting: Obstetrics

## 2022-03-30 ENCOUNTER — Other Ambulatory Visit: Payer: Self-pay | Admitting: Advanced Practice Midwife

## 2022-03-30 ENCOUNTER — Encounter: Payer: Self-pay | Admitting: Obstetrics

## 2022-03-30 VITALS — BP 134/90 | HR 90 | Wt 194.0 lb

## 2022-03-30 DIAGNOSIS — Z3400 Encounter for supervision of normal first pregnancy, unspecified trimester: Secondary | ICD-10-CM

## 2022-03-30 NOTE — Progress Notes (Signed)
Subjective:  ?Renee Ferrell is a 26 y.o. G1P0 at [redacted]w[redacted]d being seen today for ongoing prenatal care.  She is currently monitored for the following issues for this low-risk pregnancy and has Supervision of normal first pregnancy, antepartum on their problem list. ? ?Patient reports backache.  Contractions: Irregular. Vag. Bleeding: None.  Movement: Present. Denies leaking of fluid.  ? ?The following portions of the patient's history were reviewed and updated as appropriate: allergies, current medications, past family history, past medical history, past social history, past surgical history and problem list. Problem list updated. ? ?Objective:  ? ?Vitals:  ? 03/30/22 1400 03/30/22 1407  ?BP: (!) 143/91 134/90  ?Pulse: 91 90  ?Weight: 194 lb (88 kg)   ? ? ?Fetal Status:     Movement: Present    ? ?General:  Alert, oriented and cooperative. Patient is in no acute distress.  ?Skin: Skin is warm and dry. No rash noted.   ?Cardiovascular: Normal heart rate noted  ?Respiratory: Normal respiratory effort, no problems with respiration noted  ?Abdomen: Soft, gravid, appropriate for gestational age. Pain/Pressure: Present     ?Pelvic:  Cervical exam performed      closed / 50% / -3 / Vtx ( OP )  ?Extremities: Normal range of motion.  Edema: Trace  ?Mental Status: Normal mood and affect. Normal behavior. Normal judgment and thought content.  ? ?Urinalysis:     ? ?Assessment and Plan:  ?Pregnancy: G1P0 at [redacted]w[redacted]d ? ?1. Supervision of normal first pregnancy, antepartum ?Rx: ?- Fetal nonstress test:  Reactive.  FHR 140-150's.  Good variability.  15x15 acccels.  No decels. ? ?Term labor symptoms and general obstetric precautions including but not limited to vaginal bleeding, contractions, leaking of fluid and fetal movement were reviewed in detail with the patient. ?Please refer to After Visit Summary for other counseling recommendations.  ? ?Return for postpartum visit.  .     IOL 04-02-2022 ? ? ?Shelly Bombard, MD  ?03/30/22  ?

## 2022-04-02 ENCOUNTER — Other Ambulatory Visit: Payer: Self-pay

## 2022-04-02 ENCOUNTER — Encounter (HOSPITAL_COMMUNITY): Payer: Self-pay | Admitting: Obstetrics and Gynecology

## 2022-04-02 ENCOUNTER — Inpatient Hospital Stay (HOSPITAL_COMMUNITY): Payer: Medicaid Other

## 2022-04-02 ENCOUNTER — Inpatient Hospital Stay (HOSPITAL_COMMUNITY)
Admission: AD | Admit: 2022-04-02 | Discharge: 2022-04-05 | DRG: 807 | Disposition: A | Discharge status: Home / Self Care | Payer: Medicaid Other | Attending: Obstetrics & Gynecology | Admitting: Obstetrics & Gynecology

## 2022-04-02 DIAGNOSIS — Z3A41 41 weeks gestation of pregnancy: Secondary | ICD-10-CM | POA: Diagnosis not present

## 2022-04-02 DIAGNOSIS — O139 Gestational [pregnancy-induced] hypertension without significant proteinuria, unspecified trimester: Secondary | ICD-10-CM

## 2022-04-02 DIAGNOSIS — O134 Gestational [pregnancy-induced] hypertension without significant proteinuria, complicating childbirth: Secondary | ICD-10-CM | POA: Diagnosis present

## 2022-04-02 DIAGNOSIS — Z23 Encounter for immunization: Secondary | ICD-10-CM

## 2022-04-02 DIAGNOSIS — O480 Post-term pregnancy: Secondary | ICD-10-CM | POA: Diagnosis present

## 2022-04-02 LAB — TYPE AND SCREEN
ABO/RH(D): O POS
Antibody Screen: NEGATIVE

## 2022-04-02 LAB — CBC
HCT: 35 % — ABNORMAL LOW (ref 36.0–46.0)
Hemoglobin: 12.4 g/dL (ref 12.0–15.0)
MCH: 31.2 pg (ref 26.0–34.0)
MCHC: 35.4 g/dL (ref 30.0–36.0)
MCV: 88.2 fL (ref 80.0–100.0)
Platelets: 173 10*3/uL (ref 150–400)
RBC: 3.97 MIL/uL (ref 3.87–5.11)
RDW: 14.4 % (ref 11.5–15.5)
WBC: 11.7 10*3/uL — ABNORMAL HIGH (ref 4.0–10.5)
nRBC: 0 % (ref 0.0–0.2)

## 2022-04-02 MED ORDER — LACTATED RINGERS IV SOLN
INTRAVENOUS | Status: DC
  Administered 2022-04-02 – 2022-04-03 (×2): 1000 mL via INTRAVENOUS

## 2022-04-02 MED ORDER — ACETAMINOPHEN 325 MG PO TABS: 650.0000 mg | ORAL_TABLET | ORAL | Status: DC | PRN

## 2022-04-02 MED ORDER — MISOPROSTOL 25 MCG QUARTER TABLET
25.0000 ug | ORAL_TABLET | ORAL | Status: DC | PRN
  Administered 2022-04-02 – 2022-04-03 (×2): 25 ug via VAGINAL
  Filled 2022-04-02 (×2): qty 1

## 2022-04-02 MED ORDER — FENTANYL CITRATE (PF) 100 MCG/2ML IJ SOLN: 100.0000 ug | INTRAMUSCULAR | Status: DC | PRN

## 2022-04-02 MED ORDER — LIDOCAINE HCL (PF) 1 % IJ SOLN: 30.0000 mL | INTRAMUSCULAR | Status: DC | PRN

## 2022-04-02 MED ORDER — TERBUTALINE SULFATE 1 MG/ML IJ SOLN: 0.2500 mg | Freq: Once | INTRAMUSCULAR | Status: DC | PRN

## 2022-04-02 MED ORDER — LACTATED RINGERS IV SOLN: 500.0000 mL | INTRAVENOUS | Status: DC | PRN

## 2022-04-02 MED ORDER — OXYTOCIN BOLUS FROM INFUSION
333.0000 mL | Freq: Once | INTRAVENOUS | Status: AC
  Administered 2022-04-04: 333 mL via INTRAVENOUS

## 2022-04-02 MED ORDER — OXYCODONE-ACETAMINOPHEN 5-325 MG PO TABS: 2.0000 | ORAL_TABLET | ORAL | Status: DC | PRN

## 2022-04-02 MED ORDER — ONDANSETRON HCL 4 MG/2ML IJ SOLN
4.0000 mg | Freq: Four times a day (QID) | INTRAMUSCULAR | Status: DC | PRN
Start: 2022-04-02 — End: 2022-04-04

## 2022-04-02 MED ORDER — OXYCODONE-ACETAMINOPHEN 5-325 MG PO TABS: 1.0000 | ORAL_TABLET | ORAL | Status: DC | PRN

## 2022-04-02 MED ORDER — SOD CITRATE-CITRIC ACID 500-334 MG/5ML PO SOLN
30.0000 mL | ORAL | Status: DC | PRN
Start: 2022-04-02 — End: 2022-04-04

## 2022-04-02 MED ORDER — OXYTOCIN-SODIUM CHLORIDE 30-0.9 UT/500ML-% IV SOLN: 2.5000 [IU]/h | INTRAVENOUS | Status: DC

## 2022-04-02 MED ORDER — OXYTOCIN-SODIUM CHLORIDE 30-0.9 UT/500ML-% IV SOLN
1.0000 m[IU]/min | INTRAVENOUS | Status: DC
  Filled 2022-04-02: qty 500

## 2022-04-03 ENCOUNTER — Encounter (HOSPITAL_COMMUNITY): Payer: Self-pay | Admitting: Obstetrics and Gynecology

## 2022-04-03 DIAGNOSIS — O139 Gestational [pregnancy-induced] hypertension without significant proteinuria, unspecified trimester: Secondary | ICD-10-CM

## 2022-04-03 LAB — CBC
HCT: 35.4 % — ABNORMAL LOW (ref 36.0–46.0)
Hemoglobin: 12 g/dL (ref 12.0–15.0)
MCH: 30.5 pg (ref 26.0–34.0)
MCHC: 33.9 g/dL (ref 30.0–36.0)
MCV: 90.1 fL (ref 80.0–100.0)
Platelets: 153 10*3/uL (ref 150–400)
RBC: 3.93 MIL/uL (ref 3.87–5.11)
RDW: 14.4 % (ref 11.5–15.5)
WBC: 10.9 10*3/uL — ABNORMAL HIGH (ref 4.0–10.5)
nRBC: 0 % (ref 0.0–0.2)

## 2022-04-03 LAB — COMPREHENSIVE METABOLIC PANEL
ALT: 15 U/L (ref 0–44)
AST: 16 U/L (ref 15–41)
Albumin: 2.6 g/dL — ABNORMAL LOW (ref 3.5–5.0)
Alkaline Phosphatase: 154 U/L — ABNORMAL HIGH (ref 38–126)
Anion gap: 4 — ABNORMAL LOW (ref 5–15)
BUN: <5 mg/dL — ABNORMAL LOW (ref 6–20)
CO2: 23 mmol/L (ref 22–32)
Calcium: 8.8 mg/dL — ABNORMAL LOW (ref 8.9–10.3)
Chloride: 109 mmol/L (ref 98–111)
Creatinine, Ser: 0.55 mg/dL (ref 0.44–1.00)
GFR, Estimated: >60 mL/min (ref >60)
Glucose, Bld: 96 mg/dL (ref 70–99)
Potassium: 4 mmol/L (ref 3.5–5.1)
Sodium: 136 mmol/L (ref 135–145)
Total Bilirubin: 0.4 mg/dL (ref 0.3–1.2)
Total Protein: 5.4 g/dL — ABNORMAL LOW (ref 6.5–8.1)

## 2022-04-03 LAB — RPR: RPR Ser Ql: NONREACTIVE

## 2022-04-03 LAB — PROTEIN / CREATININE RATIO, URINE
Creatinine, Urine: 30.12 mg/dL
Total Protein, Urine: <6 mg/dL

## 2022-04-03 MED ORDER — MISOPROSTOL 50MCG HALF TABLET
50.0000 ug | ORAL_TABLET | ORAL | Status: DC | PRN
  Administered 2022-04-03 (×4): 50 ug via BUCCAL
  Filled 2022-04-03 (×4): qty 1

## 2022-04-03 NOTE — Progress Notes (Signed)
Shamikia Linskey is a 26 y.o. G1P0 at [redacted]w[redacted]d admitted for induction of labor for post dates. ? ?Subjective: ?Rashida is doing well. Reports low back pain that radiates into lower abdomen. Requesting heating pad for pain relief. Significant other is present at bedside. ? ?Objective: ?BP (!) 145/82   Pulse 93   Temp 98.2 ?F (36.8 ?C) (Oral)   Resp 16   Ht 5\' 3"  (1.6 m)   Wt 89 kg   LMP 06/19/2021 (Within Days)   BMI 34.76 kg/m?  ?No intake/output data recorded. ?No intake/output data recorded. ? ?FHT: 135 bpm, moderate variability, +15x15 accels, no decels ?UC: irregular ?SVE:   Dilation: 2 ?Effacement (%): 70 ?Station: -1 ?Exam by:: [email or web address], RN ? ?Labs: ?Lab Results  ?Component Value Date  ? WBC 10.9 (H) 04/03/2022  ? HGB 12.0 04/03/2022  ? HCT 35.4 (L) 04/03/2022  ? MCV 90.1 04/03/2022  ? PLT 153 04/03/2022  ? ? ?Assessment / Plan: ?Shawnae Leiva is a 26 y.o. G1P0 at [redacted]w[redacted]d admitted for IOL for postdates ? ?Labor: S/p cytotec x5 doses. Patient desires 1 more dose of cytotec prior to starting pitocin. Will give 1 additional dose and plan to start pitocin at next check. ?gHTN: BP's normotensive to mild range. Labs wnl. Patient asymptomatic. Continue to monitor ?Fetal Wellbeing:  Category I ?Pain Control:  Epidural as desired ?I/D:  GBS negative ?Anticipated MOD:  NSVD ? ? ?[redacted]w[redacted]d, CNM ?04/03/2022, 10:25 PM ? ? ?

## 2022-04-03 NOTE — H&P (Signed)
?Renee Ferrell is a 26 y.o. female G1P0 with IUP at [redacted]w[redacted]d by LMP  presenting for IOL for postdates.  ?She reports positive fetal movement. She denies leakage of fluid or vaginal bleeding. ? ?Prenatal History/Complications: ?PNC at [facility] and then transferred to [facility].  ?Pregnancy complications:  ?- Past Medical History: ?Past Medical History:  ?Diagnosis Date  ? Renal disorder   ? hydronephrosis L  ? ? ?Past Surgical History: ?Past Surgical History:  ?Procedure Laterality Date  ? KIDNEY SURGERY Left 2013  ? with stent placement and removal  ? ? ?Obstetrical History: ?OB History   ? ? Gravida  ?1  ? Para  ?   ? Term  ?   ? Preterm  ?   ? AB  ?   ? Living  ?   ?  ? ? SAB  ?   ? IAB  ?   ? Ectopic  ?   ? Multiple  ?   ? Live Births  ?   ?   ?  ?  ? ? ? ?Social History: ?Social History  ? ?Socioeconomic History  ? Marital status: Single  ?  Spouse name: Not on file  ? Number of children: 0  ? Years of education: Not on file  ? Highest education level: Bachelor's degree (e.g., BA, AB, BS)  ?Occupational History  ? Occupation: Financial trader  ?  Comment: [employer]  ?Tobacco Use  ? Smoking status: Never  ? Smokeless tobacco: Never  ?Vaping Use  ? Vaping Use: Never used  ?Substance and Sexual Activity  ? Alcohol use: Not Currently  ?  Comment: socially  ? Drug use: No  ? Sexual activity: Yes  ?  Birth control/protection: Pill  ?Other Topics Concern  ? Not on file  ?Social History Narrative  ? Works TEFL teacher.    ? ?Social Determinants of Health  ? ?Financial Resource Strain: Low Risk   ? Difficulty of Paying Living Expenses: Not hard at all  ?Food Insecurity: No Food Insecurity  ? Worried About Programme researcher, broadcasting/film/video in the Last Year: Never true  ? Ran Out of Food in the Last Year: Never true  ?Transportation Needs: No Transportation Needs  ? Lack of Transportation (Medical): No  ? Lack of Transportation (Non-Medical): No  ?Physical Activity: Sufficiently Active  ? Days of Exercise per Week: 3 days  ? Minutes of Exercise per  Session: 60 min  ?Stress: No Stress Concern Present  ? Feeling of Stress : Not at all  ?Social Connections: Moderately Isolated  ? Frequency of Communication with Friends and Family: More than three times a week  ? Frequency of Social Gatherings with Friends and Family: More than three times a week  ? Attends Religious Services: 1 to 4 times per year  ? Active Member of Clubs or Organizations: No  ? Attends Banker Meetings: Never  ? Marital Status: Never married  ? ? ?Family History: ?Family History  ?Problem Relation Age of Onset  ? Hypertension Mother   ? Diabetes Paternal Grandmother   ? Hypertension Paternal Grandfather   ? Diabetes Paternal Grandfather   ? ? ?Allergies: ?No Known Allergies ? ?Medications Prior to Admission  ?Medication Sig Dispense Refill Last Dose  ? Prenatal Vit-Fe Fumarate-FA (MULTIVITAMIN-PRENATAL) 27-0.8 MG TABS tablet Take 1 tablet by mouth daily at 12 noon.   04/01/2022  ? pantoprazole (PROTONIX) 40 MG tablet Take 1 tablet (40 mg total) by mouth daily. (Patient not taking: Reported  on 03/22/2022) 30 tablet 0   ? ? ?Review of Systems  ? ?Constitutional: Negative for fever and chills ?Eyes: Negative for visual disturbances ?Respiratory: Negative for shortness of breath, dyspnea ?Cardiovascular: Negative for chest pain or palpitations  ?Gastrointestinal: Negative for vomiting, diarrhea and constipation.  POSITIVE for abdominal pain (contractions) ?Genitourinary: Negative for dysuria and urgency ?Musculoskeletal: Negative for back pain, joint pain, myalgias  ?Neurological: Negative for dizziness and headaches ? ?Blood pressure (!) 144/93, pulse 76, temperature 98.2 ?F (36.8 ?C), temperature source Oral, resp. rate 20, height 5\' 3"  (1.6 m), weight 89 kg, last menstrual period 06/19/2021. ?General appearance: alert, cooperative, and no distress ?Lungs: normal respiratory effort ?Heart: regular rate and rhythm ?Abdomen: soft, non-tender; bowel sounds normal ?Extremities: Homans sign  is negative, no sign of DVT ?DTR's 2+ ?Presentation: cephalic per RN check ?Fetal monitoring  Baseline: 140 bpm; mod var, present acel, no decels,  ?Uterine activity  q 2-3 ?Dilation: Closed ?Effacement (%): Thick ?Station: -3 ?Exam by:: [email or web address], RN ? ? 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 ?Prenatal Transfer Tool  ?Maternal Diabetes: No ?Genetic Screening: Normal ?Maternal Ultrasounds/Referrals: Normal ?Fetal Ultrasounds or other Referrals:  None ?Maternal Substance Abuse:  No ?Significant Maternal Medications:  None ?Significant Maternal Lab Results: Group B Strep negative ? ?Results for orders placed or performed during the hospital encounter of 04/02/22 (from the past 24 hour(s))  ?CBC  ? Collection Time: 04/02/22 10:32 PM  ?Result Value Ref Range  ? WBC 11.7 (H) 4.0 - 10.5 K/uL  ? RBC 3.97 3.87 - 5.11 MIL/uL  ? Hemoglobin 12.4 12.0 - 15.0 g/dL  ? HCT 35.0 (L) 36.0 - 46.0 %  ? MCV 88.2 80.0 - 100.0 fL  ? MCH 31.2 26.0 - 34.0 pg  ? MCHC 35.4 30.0 - 36.0 g/dL  ? RDW 14.4 11.5 - 15.5 %  ? Platelets 173 150 - 400 K/uL  ? nRBC 0.0 0.0 - 0.2 %  ?Type and screen  ? Collection Time: 04/02/22 10:32 PM  ?Result Value Ref Range  ? ABO/RH(D) O POS   ? Antibody Screen NEG   ? Sample Expiration    ?  04/05/2022,2359 ?Performed at [facility], [address], [place], [place] ?  ? ? ?Assessment: ?Renee Ferrell is a 26 y.o. G1P0 with an IUP at [redacted]w[redacted]d presenting for IOl for post dates at 41 weeks. Cervix is FT; will start with cytotec.  ? ?Plan: ?#Labor: expectant management ?#Pain:  Per request ?#FWB Cat 1 ?#ID: GBS: neg ?#MOF:  breast ?#MOC: OCP ?#Circ: girl ? ? ?[redacted]w[redacted]d Aleza Pew ?04/03/2022, 1:30 AM ? ? ?

## 2022-04-03 NOTE — Progress Notes (Signed)
Labor Progress Note ?Renee Ferrell is a 26 y.o. G1P0 at [redacted]w[redacted]d presented for IOL for postdates ? ?S: introduced myself to patient and assumed care. having some minor cramping but not feeling contraction. ? ?O:  ?BP 122/63   Pulse 86   Temp 98.2 ?F (36.8 ?C) (Oral)   Resp 16   Ht 5\' 3"  (1.6 m)   Wt 89 kg   LMP 06/19/2021 (Within Days)   BMI 34.76 kg/m?  ?EFM: 130/mod/+accels, no decels ? ?CVE: Dilation: 1 ?Effacement (%): 70 ?Cervical Position: Posterior ?Station: -1 ?Presentation: Vertex ?Exam by:: dr [email or web address] ? ?Attempted FB x 2 but cervix was so thin that balloon immediately pulled through.  ? ?A&P: 26 y.o. G1P0 [redacted]w[redacted]d postdates induction ?#Labor: Progressing well. 4th dose of cytoec given orally. Will consider pitocin at next check in 4 hours ?#Pain: coping well. Encouraged her to walk and do pelvic tilts to help positioning ?#FWB: Cat 1 ?#GBS negative ? ?Anticipate vaginal delivery.  ? ?[redacted]w[redacted]d, MD ?11:54 AM ?IOl for post ?

## 2022-04-03 NOTE — Progress Notes (Signed)
Patient ID: Lucine Bilski, female   DOB: [birth year], 26 y.o.   MRN: [id number] ? ? ?Jaymarie Yeakel is a 26 y.o. G1P0 at [redacted]w[redacted]d  admitted for induction of labor due to Post dates. Due date 03/26/2022. ? ?Subjective: ? ?Doing well ?Objective: ?Vitals:  ? 04/03/22 0034 04/03/22 8891 04/03/22 0547 04/03/22 0741  ?BP: (!) 144/93 136/80 130/80 (!) 143/82  ?Pulse: 76 76 73 77  ?Resp:    16  ?Temp:  98.3 ?F (36.8 ?C)  98.4 ?F (36.9 ?C)  ?TempSrc:  Oral  Oral  ?Weight:      ?Height:      ? ?No intake/output data recorded. ? ?FHT:  FHR: 130 bpm, variability: moderate,  accelerations:  Present,  decelerations:  Absent ?UC:   irregular, every 1-2 minutes ?SVE:   Dilation: Closed ?Effacement (%): Thick ?Station: -3 ?Exam by:: k fields, rn ?Pitocin @ 0 mu/min ? ?Labs: ?Lab Results  ?Component Value Date  ? WBC 11.7 (H) 04/02/2022  ? HGB 12.4 04/02/2022  ? HCT 35.0 (L) 04/02/2022  ? MCV 88.2 04/02/2022  ? PLT 173 04/02/2022  ? ? ?Assessment / Plan: ?  Doing well,  s/p 3 doses of cytotec ?Now with elevated BPs, will draw pre-e labs ?Labor:  early laborl  ?Fetal Wellbeing:  Category I ?Pain Control:  Labor support without medications ?Anticipated MOD:  NSVD ? ?Charlesetta Garibaldi Micaylah Bertucci ?04/03/2022, 7:53 AM ? ? ? ? ?

## 2022-04-04 ENCOUNTER — Inpatient Hospital Stay (HOSPITAL_COMMUNITY): Payer: Medicaid Other | Admitting: Anesthesiology

## 2022-04-04 ENCOUNTER — Encounter (HOSPITAL_COMMUNITY): Payer: Self-pay | Admitting: Obstetrics and Gynecology

## 2022-04-04 DIAGNOSIS — Z3A41 41 weeks gestation of pregnancy: Secondary | ICD-10-CM

## 2022-04-04 DIAGNOSIS — O480 Post-term pregnancy: Secondary | ICD-10-CM

## 2022-04-04 DIAGNOSIS — O134 Gestational [pregnancy-induced] hypertension without significant proteinuria, complicating childbirth: Secondary | ICD-10-CM

## 2022-04-04 LAB — BIRTH TISSUE RECOVERY COLLECTION (PLACENTA DONATION)

## 2022-04-04 LAB — CBC
HCT: 35.8 % — ABNORMAL LOW (ref 36.0–46.0)
Hemoglobin: 12.8 g/dL (ref 12.0–15.0)
MCH: 31.5 pg (ref 26.0–34.0)
MCHC: 35.8 g/dL (ref 30.0–36.0)
MCV: 88.2 fL (ref 80.0–100.0)
Platelets: 158 10*3/uL (ref 150–400)
RBC: 4.06 MIL/uL (ref 3.87–5.11)
RDW: 14.3 % (ref 11.5–15.5)
WBC: 15.1 10*3/uL — ABNORMAL HIGH (ref 4.0–10.5)
nRBC: 0 % (ref 0.0–0.2)

## 2022-04-04 MED ORDER — PHENYLEPHRINE 40 MCG/ML (10ML) SYRINGE FOR IV PUSH (FOR BLOOD PRESSURE SUPPORT)
80.0000 ug | PREFILLED_SYRINGE | INTRAVENOUS | Status: DC | PRN
  Filled 2022-04-04: qty 10

## 2022-04-04 MED ORDER — ACETAMINOPHEN 325 MG PO TABS
650.0000 mg | ORAL_TABLET | ORAL | Status: DC | PRN
  Administered 2022-04-05: 650 mg via ORAL
  Filled 2022-04-04: qty 2

## 2022-04-04 MED ORDER — ONDANSETRON HCL 4 MG PO TABS: 4.0000 mg | ORAL_TABLET | ORAL | Status: DC | PRN

## 2022-04-04 MED ORDER — WITCH HAZEL-GLYCERIN EX PADS
1.0000 "application " | MEDICATED_PAD | CUTANEOUS | Status: DC | PRN
  Administered 2022-04-05: 1 via TOPICAL

## 2022-04-04 MED ORDER — EPHEDRINE 5 MG/ML INJ: 10.0000 mg | INTRAVENOUS | Status: DC | PRN

## 2022-04-04 MED ORDER — LACTATED RINGERS IV SOLN
500.0000 mL | Freq: Once | INTRAVENOUS | Status: AC
  Administered 2022-04-04: 500 mL via INTRAVENOUS

## 2022-04-04 MED ORDER — BENZOCAINE-MENTHOL 20-0.5 % EX AERO
1.0000 | INHALATION_SPRAY | CUTANEOUS | Status: DC | PRN
Start: 2022-04-04 — End: 2022-04-05
  Administered 2022-04-04 – 2022-04-05 (×2): 1 via TOPICAL
  Filled 2022-04-04 (×2): qty 56

## 2022-04-04 MED ORDER — PHENYLEPHRINE 40 MCG/ML (10ML) SYRINGE FOR IV PUSH (FOR BLOOD PRESSURE SUPPORT): 80.0000 ug | PREFILLED_SYRINGE | INTRAVENOUS | Status: DC | PRN

## 2022-04-04 MED ORDER — ZOLPIDEM TARTRATE 5 MG PO TABS: 5.0000 mg | ORAL_TABLET | Freq: Every evening | ORAL | Status: DC | PRN

## 2022-04-04 MED ORDER — DIPHENHYDRAMINE HCL 25 MG PO CAPS: 25.0000 mg | ORAL_CAPSULE | Freq: Four times a day (QID) | ORAL | Status: DC | PRN

## 2022-04-04 MED ORDER — LIDOCAINE-EPINEPHRINE (PF) 2 %-1:200000 IJ SOLN
INTRAMUSCULAR | Status: DC | PRN
  Administered 2022-04-04: 5 mL via EPIDURAL

## 2022-04-04 MED ORDER — SIMETHICONE 80 MG PO CHEW: 80.0000 mg | CHEWABLE_TABLET | ORAL | Status: DC | PRN

## 2022-04-04 MED ORDER — ONDANSETRON HCL 4 MG/2ML IJ SOLN: 4.0000 mg | INTRAMUSCULAR | Status: DC | PRN

## 2022-04-04 MED ORDER — SENNOSIDES-DOCUSATE SODIUM 8.6-50 MG PO TABS
2.0000 | ORAL_TABLET | Freq: Every day | ORAL | Status: DC
  Administered 2022-04-04 – 2022-04-05 (×2): 2 via ORAL
  Filled 2022-04-04 (×2): qty 2

## 2022-04-04 MED ORDER — OXYTOCIN-SODIUM CHLORIDE 30-0.9 UT/500ML-% IV SOLN
1.0000 m[IU]/min | INTRAVENOUS | Status: DC
  Administered 2022-04-04: 2 m[IU]/min via INTRAVENOUS

## 2022-04-04 MED ORDER — HYDROXYZINE HCL 25 MG PO TABS
50.0000 mg | ORAL_TABLET | Freq: Four times a day (QID) | ORAL | Status: DC | PRN
  Administered 2022-04-04: 50 mg via ORAL
  Filled 2022-04-04: qty 1

## 2022-04-04 MED ORDER — IBUPROFEN 600 MG PO TABS
600.0000 mg | ORAL_TABLET | Freq: Four times a day (QID) | ORAL | Status: DC
  Administered 2022-04-04 – 2022-04-05 (×4): 600 mg via ORAL
  Filled 2022-04-04 (×4): qty 1

## 2022-04-04 MED ORDER — TETANUS-DIPHTH-ACELL PERTUSSIS 5-2.5-18.5 LF-MCG/0.5 IM SUSY
0.5000 mL | PREFILLED_SYRINGE | Freq: Once | INTRAMUSCULAR | Status: AC
  Administered 2022-04-05: 0.5 mL via INTRAMUSCULAR
  Filled 2022-04-04: qty 0.5

## 2022-04-04 MED ORDER — TERBUTALINE SULFATE 1 MG/ML IJ SOLN: 0.2500 mg | Freq: Once | INTRAMUSCULAR | Status: DC | PRN

## 2022-04-04 MED ORDER — FENTANYL-BUPIVACAINE-NACL 0.5-0.125-0.9 MG/250ML-% EP SOLN
12.0000 mL/h | EPIDURAL | Status: DC | PRN
  Administered 2022-04-04: 12 mL/h via EPIDURAL
  Filled 2022-04-04: qty 250

## 2022-04-04 MED ORDER — DIBUCAINE (PERIANAL) 1 % EX OINT
1.0000 "application " | TOPICAL_OINTMENT | CUTANEOUS | Status: DC | PRN
  Administered 2022-04-05: 1 via RECTAL
  Filled 2022-04-04: qty 28

## 2022-04-04 MED ORDER — FENTANYL-BUPIVACAINE-NACL 0.5-0.125-0.9 MG/250ML-% EP SOLN: 12.0000 mL/h | EPIDURAL | Status: DC | PRN

## 2022-04-04 MED ORDER — DIPHENHYDRAMINE HCL 50 MG/ML IJ SOLN: 12.5000 mg | INTRAMUSCULAR | Status: DC | PRN

## 2022-04-04 MED ORDER — PRENATAL MULTIVITAMIN CH
1.0000 | ORAL_TABLET | Freq: Every day | ORAL | Status: DC
  Administered 2022-04-04 – 2022-04-05 (×2): 1 via ORAL
  Filled 2022-04-04 (×2): qty 1

## 2022-04-04 MED ORDER — COCONUT OIL OIL: 1.0000 "application " | TOPICAL_OIL | Status: DC | PRN

## 2022-04-04 NOTE — Lactation Note (Signed)
This note was copied from a baby's chart. ?Lactation Consultation Note ? ?Patient Name: Renee Ferrell ?Today's Date: 04/04/2022 ?Reason for consult: L&D Initial assessment;1st time breastfeeding;Primapara;Term;Breastfeeding assistance;Other (Comment) (LC LD visit at less than 60 minsPP. Baby STS awake, rooting. LC offered to assist and mom receptive. several attempts to latch on both breast, on and off. Latched the longest on the right breast. per mom baby stooled at birth. Mom aware LC will F/U.) ?Age:27 hours ? ? ?LC Note: ? ?Attempted to visit with family, however, all members asleep at this time; will return later today for an initial visit. ? ? ?Maternal Data ?  ? ?Feeding ?Mother's Current Feeding Choice: Breast Milk ? ?LATCH Score ?Latch: Repeated attempts needed to sustain latch, nipple held in mouth throughout feeding, stimulation needed to elicit sucking reflex. ? ?Audible Swallowing: None ? ?Type of Nipple: Everted at rest and after stimulation (semi compressible areola) ? ?Comfort (Breast/Nipple): Soft / non-tender ? ?Hold (Positioning): Assistance needed to correctly position infant at breast and maintain latch. ? ?LATCH Score: 6 ? ? ?Lactation Tools Discussed/Used ?  ? ?Interventions ?Interventions: Breast feeding basics reviewed;Assisted with latch;Skin to skin;Breast massage;Hand express;Reverse pressure;Support pillows ? ?Discharge ?WIC Program: Yes ? ?Consult Status ?Consult Status: Follow-up from L&D ?Date: 04/04/22 ?Follow-up type: In-patient ? ? ? ?Damiah Mcdonald R Avelino Herren ?04/04/2022, 10:56 AM ? ? ? ?

## 2022-04-04 NOTE — Lactation Note (Signed)
This note was copied from a baby's chart. ?Lactation Consultation Note ? ?Patient Name: Renee Ferrell ?Today's Date: 04/04/2022 ?Reason for consult: L&D Initial assessment;1st time breastfeeding;Primapara;Term;Breastfeeding assistance;Other (Comment) (LC LD visit at less than 60 minsPP. Baby STS awake, rooting. LC offered to assist and mom receptive. several attempts to latch on both breast, on and off. Latched the longest on the right breast. per mom baby stooled at birth. Mom aware LC will F/U.) ?Age:27 mins PP - Latch 6  ? ? ? ?Maternal Data ?  ? ?Feeding ?Mother's Current Feeding Choice: Breast Milk ? ?LATCH Score ?Latch: Repeated attempts needed to sustain latch, nipple held in mouth throughout feeding, stimulation needed to elicit sucking reflex. ? ?Audible Swallowing: None ? ?Type of Nipple: Everted at rest and after stimulation (semi compressible areola) ? ?Comfort (Breast/Nipple): Soft / non-tender ? ?Hold (Positioning): Assistance needed to correctly position infant at breast and maintain latch. ? ?LATCH Score: 6 ? ? ?Lactation Tools Discussed/Used ?  ? ?Interventions ?Interventions: Breast feeding basics reviewed;Assisted with latch;Skin to skin;Breast massage;Hand express;Reverse pressure;Support pillows ? ?Discharge ?WIC Program: Yes ? ?Consult Status ?Consult Status: Follow-up from L&D ?Date: 04/04/22 ?Follow-up type: In-patient ? ? ? ?Matilde Sprang Angas Isabell ?04/04/2022, 7:40 AM ? ? ? ?

## 2022-04-04 NOTE — Lactation Note (Signed)
This note was copied from a baby's chart. ?Lactation Consultation Note ? ?Patient Name: Renee Ferrell ?Today's Date: 04/04/2022 ?Reason for consult: Initial assessment;Term;Primapara;1st time breastfeeding ?Age:27 hours ? ? ?P1 mother whose infant is now 93 hours old.  This is a term baby at 41+2 weeks.  Mother's current feeding preference is breast. ? ?Baby "[name]" was swaddled and being held by grandmother when I arrived.  Offered to assist with waking and latching; mother interested.  Mother is unable to express drops at this time but will continue to practice and feed back any EBM to baby. ? ?Attempted to latch in the football hold, however, she was not interested.  Gentle stimulation performed.  Breast feeding basics reviewed.  Mother will call for assistance at the next feeding.   ? ?Father and visitors present.   ? ? ?Maternal Data ?Has patient been taught Hand Expression?: Yes ?Does the patient have breastfeeding experience prior to this delivery?: No ? ?Feeding ?Mother's Current Feeding Choice: Breast Milk ? ?LATCH Score ?Latch: Too sleepy or reluctant, no latch achieved, no sucking elicited. ? ?Audible Swallowing: None ? ?Type of Nipple: Everted at rest and after stimulation ? ?Comfort (Breast/Nipple): Soft / non-tender ? ?Hold (Positioning): Assistance needed to correctly position infant at breast and maintain latch. ? ?LATCH Score: 5 ? ? ?Lactation Tools Discussed/Used ?  ? ?Interventions ?Interventions: Breast feeding basics reviewed;Assisted with latch;Skin to skin;Breast massage;Hand express;Breast compression;Position options;Support pillows;Adjust position;Education ? ?Discharge ?Pump: Personal;Manual ?WIC Program: No ? ?Consult Status ?Consult Status: Follow-up ?Date: 04/05/22 ?Follow-up type: In-patient ? ? ? ?Khadeejah Castner R Matalie Romberger ?04/04/2022, 1:37 PM ? ? ? ?

## 2022-04-04 NOTE — Lactation Note (Signed)
This note was copied from a baby's chart. ?Lactation Consultation Note ? ?Patient Name: Renee Ferrell ?Today's Date: 04/04/2022 ?Reason for consult: Mother's request;Difficult latch;Term;1st time breastfeeding;Initial assessment ?Age:27 hours, P1, term female infant. ?Per mom, infant has not been latching well at the breast. ?Mom latched infant on her right breast using the football hold position, infant was on and off breast, breastfeed for 10 minutes, then switched to mom's left breast using the football hold position and BF for 5 minutes. ?Infant's total feeding was 15 minutes. ?Mom will continue to work towards latching infant at the breast and knows to call RN/LC for further latch assistance if needed. ?Mom was taught hand expression earlier, LC reviewed with breast model and mom self expressed 2 mls of colostrum that was spoon feed to infant. ?Mom was doing skin to skin with infant when [facility] left the room. ?Mom will continue to breastfeed infant according to hunger cues, 8 to 12+ or more times within 24 hours. ?Maternal Data ?  ? ?Feeding ?Mother's Current Feeding Choice: Breast Milk ? ?LATCH Score ?Latch: Repeated attempts needed to sustain latch, nipple held in mouth throughout feeding, stimulation needed to elicit sucking reflex. ? ?Audible Swallowing: A few with stimulation ? ?Type of Nipple: Everted at rest and after stimulation ? ?Comfort (Breast/Nipple): Soft / non-tender ? ?Hold (Positioning): Assistance needed to correctly position infant at breast and maintain latch. ? ?LATCH Score: 7 ? ? ?Lactation Tools Discussed/Used ?  ? ?Interventions ?Interventions: Skin to skin;Assisted with latch;Breast compression;Adjust position;Support pillows;Position options;Expressed milk;Hand express;Education;LPT handout/interventions ? ?Discharge ?  ? ?Consult Status ?Consult Status: Follow-up ?Date: 04/05/22 ?Follow-up type: In-patient ? ? ? ?Danelle Earthly ?04/04/2022, 10:38 PM ? ? ? ?

## 2022-04-04 NOTE — Discharge Summary (Signed)
? ?  Postpartum Discharge Summary ? ?   ?Patient Name: Renee Ferrell ?DOB: 08-08-1996 ?MRN: [id number] ? ?Date of admission: 04/02/2022 ?Delivery date:04/04/2022  ?Delivering provider: Maryagnes Amos L  ?Date of discharge: 04/05/2022 ? ?Admitting diagnosis: Post term pregnancy over 40 weeks [O48.0] ?Intrauterine pregnancy: [redacted]w[redacted]d     ?Secondary diagnosis:  Principal Problem: ?  Post term pregnancy over 40 weeks ?Active Problems: ?  Gestational hypertension ?  SVD (spontaneous vaginal delivery) ? ?Additional problems: None    ?Discharge diagnosis: Term Pregnancy Delivered                                              ?Post partum procedures: None ?Augmentation: Pitocin and Cytotec ?Complications: None ? ?Hospital course: Induction of Labor With Vaginal Delivery   ?26 y.o. yo G1P0 at [redacted]w[redacted]d was admitted to the hospital 04/02/2022 for induction of labor.  Indication for induction: Postdates.  Patient had an uncomplicated labor course as follows: ?Membrane Rupture Time/Date: 5:15 AM ,04/04/2022   ?Delivery Method:Vaginal, Spontaneous  ?Episiotomy: None  ?Lacerations:  1st degree  ?Details of delivery can be found in separate delivery note.  Patient had a routine postpartum course. Due to her hx of gHTN she was started on a 5 day course of PO Lasix and she was also started on Procardia $RemoveBefo'30mg'ACAraRyzgVc$  as she had more than 3 BP in the post partum period that were above 130s/80s. Patient is discharged home 04/05/22. ? ?Newborn Data: ?Birth date:04/04/2022  ?Birth time:6:25 AM  ?Gender:Female  ?Living status:Living  ?Apgars:8 ,9  ?Weight:3200 g  ? ?Magnesium Sulfate received: No ?BMZ received: No ?Rhophylac:N/A ?MMR:N/A ?T-DaP: No ?Flu: No ?Transfusion:No ? ?Physical exam  ?Vitals:  ? 04/04/22 1400 04/04/22 1800 04/04/22 2130 04/05/22 0630  ?BP: 134/85 134/74 124/73 127/80  ?Pulse: 89 75 89 87  ?Resp: $Remov'18 18 17 16  'adyMUz$ ?Temp: 99.1 ?F (37.3 ?C) 97.8 ?F (36.6 ?C) 98.1 ?F (36.7 ?C) 98 ?F (36.7 ?C)  ?TempSrc: Oral Oral Oral Oral  ?SpO2: 100%  98% 99%   ?Weight:      ?Height:      ? ?General: alert ?Lochia: appropriate ?Uterine Fundus: firm ?Incision: N/A ?DVT Evaluation: No evidence of DVT seen on physical exam. ?Labs: ?Lab Results  ?Component Value Date  ? WBC 15.1 (H) 04/04/2022  ? HGB 12.8 04/04/2022  ? HCT 35.8 (L) 04/04/2022  ? MCV 88.2 04/04/2022  ? PLT 158 04/04/2022  ? ? ?  Latest Ref Rng & Units 04/03/2022  ?  7:57 AM  ?CMP  ?Glucose 70 - 99 mg/dL 96    ?BUN 6 - 20 mg/dL <5    ?Creatinine 0.44 - 1.00 mg/dL 0.55    ?Sodium 135 - 145 mmol/L 136    ?Potassium 3.5 - 5.1 mmol/L 4.0    ?Chloride 98 - 111 mmol/L 109    ?CO2 22 - 32 mmol/L 23    ?Calcium 8.9 - 10.3 mg/dL 8.8    ?Total Protein 6.5 - 8.1 g/dL 5.4    ?Total Bilirubin 0.3 - 1.2 mg/dL 0.4    ?Alkaline Phos 38 - 126 U/L 154    ?AST 15 - 41 U/L 16    ?ALT 0 - 44 U/L 15    ? ?Edinburgh Score: ? ?  04/04/2022  ?  9:30 AM  ?Flavia Shipper Postnatal Depression Scale Screening Tool  ?I have been able to  laugh and see the funny side of things. 0  ?I have looked forward with enjoyment to things. 0  ?I have blamed myself unnecessarily when things went wrong. 1  ?I have been anxious or worried for no good reason. 0  ?I have felt scared or panicky for no good reason. 0  ?Things have been getting on top of me. 1  ?I have been so unhappy that I have had difficulty sleeping. 0  ?I have felt sad or miserable. 0  ?I have been so unhappy that I have been crying. 0  ?The thought of harming myself has occurred to me. 0  ?Edinburgh Postnatal Depression Scale Total 2  ? ? ? ?After visit meds:  ?Allergies as of 04/05/2022   ?No Known Allergies ?  ? ?  ?Medication List  ?  ? ?STOP taking these medications   ? ?pantoprazole 40 MG tablet ?Commonly known as: Protonix ?  ? ?  ? ?TAKE these medications   ? ?acetaminophen 325 MG tablet ?Commonly known as: Tylenol ?Take 2 tablets (650 mg total) by mouth every 4 (four) hours as needed (for pain scale < 4). ?  ?furosemide 20 MG tablet ?Commonly known as: LASIX ?Take 1 tablet (20 mg total) by  mouth daily. ?  ?ibuprofen 600 MG tablet ?Commonly known as: ADVIL ?Take 1 tablet (600 mg total) by mouth every 6 (six) hours. ?  ?multivitamin-prenatal 27-0.8 MG Tabs tablet ?Take 1 tablet by mouth daily at 12 noon. ?  ?NIFEdipine 30 MG 24 hr tablet ?Commonly known as: ADALAT CC ?Take 1 tablet (30 mg total) by mouth daily. ?  ?polyethylene glycol 17 g packet ?Commonly known as: MIRALAX / GLYCOLAX ?Take 17 g by mouth daily. ?  ? ?  ? ? ? ?Discharge home in stable condition ?Infant Feeding: Breast ?Infant Disposition:home with mother ?Discharge instruction: per After Visit Summary and Postpartum booklet. ?Activity: Advance as tolerated. Pelvic rest for 6 weeks.  ?Diet: routine diet ?Future Appointments: ?Future Appointments  ?Date Time Provider [place]  ?04/11/2022  4:00 PM [facility] NURSE [facility] None  ?05/16/2022 10:55 AM Constant, Vickii Chafe, MD [facility] None  ? ?Follow up Visit: ?Please schedule this patient for a In person postpartum visit in 6 weeks with the following provider: Any provider. ?Additional Postpartum F/U:BP check 1 week  ?Low risk pregnancy complicated by: HTN ?Delivery mode:  Vaginal, Spontaneous  ?Anticipated Birth Control:  POPs ? ?Renard Matter, MD, MPH ?OB Fellow, Faculty Practice ? ? ? ?

## 2022-04-04 NOTE — Anesthesia Preprocedure Evaluation (Addendum)
Anesthesia Evaluation  ?Patient identified by MRN, date of birth, ID band ?Patient awake ? ? ? ?Reviewed: ?Allergy & Precautions, Patient's Chart, lab work & pertinent test results ? ?Airway ?Mallampati: I ? ? ? ? ? ? Dental ?no notable dental hx. ? ?  ?Pulmonary ?neg pulmonary ROS,  ?  ?Pulmonary exam normal ? ? ? ? ? ? ? Cardiovascular ?hypertension, Normal cardiovascular exam ? ? ?  ?Neuro/Psych ?  ? GI/Hepatic ?GERD  Medicated,  ?Endo/Other  ? ? Renal/GU ?Renal disease  ? ?  ?Musculoskeletal ? ? Abdominal ?  ?Peds ? Hematology ?negative hematology ROS ?(+)   ?Anesthesia Other Findings ? ? Reproductive/Obstetrics ?(+) Pregnancy ? ?  ? ? ? ? ? ? ? ? ? ? ? ? ? ?  ?  ? ? ? ? ? ? ? ?Anesthesia Physical ?Anesthesia Plan ? ?ASA: 2 ? ?Anesthesia Plan: Epidural  ? ?Post-op Pain Management:   ? ?Induction:  ? ?PONV Risk Score and Plan: 0 ? ?Airway Management Planned: Natural Airway ? ?Additional Equipment: None ? ?Intra-op Plan:  ? ?Post-operative Plan:  ? ?Informed Consent: I have reviewed the patients History and Physical, chart, labs and discussed the procedure including the risks, benefits and alternatives for the proposed anesthesia with the patient or authorized representative who has indicated his/her understanding and acceptance.  ? ? ? ? ? ?Plan Discussed with:  ? ?Anesthesia Plan Comments: (Lab Results ?     Component                Value               Date                 ?     WBC                      PENDING             04/04/2022           ?     HGB                      12.8                04/04/2022           ?     HCT                      35.8 (L)            04/04/2022           ?     MCV                      88.2                04/04/2022           ?     PLT                      158                 04/04/2022           ?)  ? ? ? ? ? ?Anesthesia Quick Evaluation ? ?

## 2022-04-04 NOTE — Anesthesia Procedure Notes (Signed)
Epidural ?Patient location during procedure: OB ?Start time: 04/04/2022 1:42 AM ?End time: 04/04/2022 1:47 AM ? ?Staffing ?Anesthesiologist: Shelton Silvas, MD ?Performed: anesthesiologist  ? ?Preanesthetic Checklist ?Completed: patient identified, IV checked, site marked, risks and benefits discussed, surgical consent, monitors and equipment checked, pre-op evaluation and timeout performed ? ?Epidural ?Patient position: sitting ?Prep: DuraPrep ?Patient monitoring: heart rate, continuous pulse ox and blood pressure ?Approach: midline ?Location: L3-L4 ?Injection technique: LOR saline ? ?Needle:  ?Needle type: Tuohy  ?Needle gauge: 17 G ?Needle length: 9 cm ?Catheter type: closed end flexible ?Catheter size: 20 Guage ?Test dose: negative and 1.5% lidocaine ? ?Assessment ?Events: blood not aspirated, injection not painful, no injection resistance and no paresthesia ? ?Additional Notes ?LOR @ 4.5 ? ?Patient identified. Risks/Benefits/Options discussed with patient including but not limited to bleeding, infection, nerve damage, paralysis, failed block, incomplete pain control, headache, blood pressure changes, nausea, vomiting, reactions to medications, itching and postpartum back pain. Confirmed with bedside nurse the patient's most recent platelet count. Confirmed with patient that they are not currently taking any anticoagulation, have any bleeding history or any family history of bleeding disorders. Patient expressed understanding and wished to proceed. All questions were answered. Sterile technique was used throughout the entire procedure. Please see nursing notes for vital signs. Test dose was given through epidural catheter and negative prior to continuing to dose epidural or start infusion. Warning signs of high block given to the patient including shortness of breath, tingling/numbness in hands, complete motor block, or any concerning symptoms with instructions to call for help. Patient was given instructions on  fall risk and not to get out of bed. All questions and concerns addressed with instructions to call with any issues or inadequate analgesia.   ? ?Reason for block:procedure for pain ? ? ? ?

## 2022-04-04 NOTE — Anesthesia Postprocedure Evaluation (Signed)
Anesthesia Post Note ? ?Patient: Renee Ferrell ? ?Procedure(s) Performed: AN AD HOC LABOR EPIDURAL ? ?  ? ?Patient location during evaluation: Mother Baby ?Anesthesia Type: Epidural ?Level of consciousness: awake and alert ?Pain management: pain level controlled ?Vital Signs Assessment: post-procedure vital signs reviewed and stable ?Respiratory status: spontaneous breathing, nonlabored ventilation and respiratory function stable ?Cardiovascular status: stable ?Postop Assessment: no headache, no backache and epidural receding ?Anesthetic complications: no ? ? ?No notable events documented. ? ?Last Vitals:  ?Vitals:  ? 04/04/22 0925 04/04/22 1400  ?BP: 135/77 134/85  ?Pulse: 84 89  ?Resp: 16 18  ?Temp: 37.2 ?C 37.3 ?C  ?SpO2: 99% 100%  ?  ?Last Pain:  ?Vitals:  ? 04/04/22 1400  ?TempSrc: Oral  ?PainSc: 1   ? ?Pain Goal:   ? ?  ?  ?  ?  ?  ?  ?  ? ?[name] ? ? ? ? ?

## 2022-04-04 NOTE — Progress Notes (Signed)
Renee Ferrell is a 26 y.o. G1P0 at [redacted]w[redacted]d admitted for induction of labor for post dates. ? ?Subjective: ?Renee Ferrell is resting comfortably with epidural. Requesting something to help with sleep.  ? ?Objective: ?BP (!) 138/92   Pulse 85   Temp 98.5 ?F (36.9 ?C) (Oral)   Resp 16   Ht 5\' 3"  (1.6 m)   Wt 89 kg   LMP 06/19/2021 (Within Days)   SpO2 99%   BMI 34.76 kg/m?  ?No intake/output data recorded. ?No intake/output data recorded. ? ?FHT: 135 bpm, moderate variability, +15x15 accels, no decels ?UC: Q 2-5 mins ?SVE:   Dilation: 3 ?Effacement (%): 80 ?Station: -1 ?Exam by:: [email or web address], RN ? ?Labs: ?Lab Results  ?Component Value Date  ? WBC 15.1 (H) 04/04/2022  ? HGB 12.8 04/04/2022  ? HCT 35.8 (L) 04/04/2022  ? MCV 88.2 04/04/2022  ? PLT 158 04/04/2022  ? ? ?Assessment / Plan: ?Renee Ferrell is a 26 y.o. G1P0 at [redacted]w[redacted]d admitted for IOL for postdates ? ?Labor: S/p cytotec. Continue pitocin titration prn per unit policy ?gHTN: BP's normotensive to mild range. Labs wnl. Patient asymptomatic. Continue to monitor ?Fetal Wellbeing:  Category I ?Pain Control:  Epidural ?I/D:  GBS negative ?Anticipated MOD:  NSVD ? ? ?[redacted]w[redacted]d, CNM ?04/04/2022, 4:07 AM ? ? ?

## 2022-04-05 ENCOUNTER — Other Ambulatory Visit (HOSPITAL_COMMUNITY): Payer: Self-pay

## 2022-04-05 MED ORDER — FUROSEMIDE 20 MG PO TABS
20.0000 mg | ORAL_TABLET | Freq: Every day | ORAL | 0 refills | Status: AC
Start: 2022-04-05 — End: ?
  Filled 2022-04-05: qty 4, 4d supply, fill #0

## 2022-04-05 MED ORDER — NIFEDIPINE ER OSMOTIC RELEASE 30 MG PO TB24
30.0000 mg | ORAL_TABLET | Freq: Every day | ORAL | Status: DC
  Administered 2022-04-05: 30 mg via ORAL
  Filled 2022-04-05: qty 1

## 2022-04-05 MED ORDER — NIFEDIPINE ER 30 MG PO TB24
30.0000 mg | ORAL_TABLET | Freq: Every day | ORAL | 0 refills | Status: AC
Start: 2022-04-05 — End: ?
  Filled 2022-04-05: qty 30, 30d supply, fill #0

## 2022-04-05 MED ORDER — POLYETHYLENE GLYCOL 3350 17 GM/SCOOP PO POWD
17.0000 g | Freq: Every day | ORAL | 0 refills | Status: AC
  Filled 2022-04-05: qty 238, 14d supply, fill #0

## 2022-04-05 MED ORDER — ACETAMINOPHEN 325 MG PO TABS
650.0000 mg | ORAL_TABLET | ORAL | 0 refills | Status: DC | PRN
Start: 2022-04-05 — End: 2024-01-22
  Filled 2022-04-05: qty 60, 5d supply, fill #0

## 2022-04-05 MED ORDER — IBUPROFEN 600 MG PO TABS
600.0000 mg | ORAL_TABLET | Freq: Four times a day (QID) | ORAL | 0 refills | Status: DC
Start: 2022-04-05 — End: 2024-01-22
  Filled 2022-04-05: qty 30, 8d supply, fill #0

## 2022-04-05 MED ORDER — FUROSEMIDE 20 MG PO TABS
20.0000 mg | ORAL_TABLET | Freq: Every day | ORAL | Status: DC
  Administered 2022-04-05: 20 mg via ORAL
  Filled 2022-04-05: qty 1

## 2022-04-05 MED ORDER — POLYETHYLENE GLYCOL 3350 17 G PO PACK
17.0000 g | PACK | Freq: Every day | ORAL | Status: DC
  Filled 2022-04-05: qty 1

## 2022-04-05 NOTE — Lactation Note (Signed)
This note was copied from a baby's chart. ?Lactation Consultation Note ? ?Patient Name: Renee Ferrell ?Today's Date: 04/05/2022 ?Reason for consult: Follow-up assessment ?Age:27 hours ? ? ?Lactation Follow Up Consult: ? ?Mother requested latch observation.  Pediatrician requested an observation also. ? ?I arrived after a previous consult and mother had already breast fed for 20 minutes.  She feels like "Renee Ferrell" latched and fed well; father followed up with 10 mls of supplementation.  Baby was asleep. ? ?Discussed feeding plan for today and tonight.  Encouraged to continue latching with every feeding making sure to get a deep latch.  Reviewed breast feeding basics with family.  Mother's nipples are intact and rounded.  No signs of trauma or improper latching.  Encouraged to continue hand expression and to feed back any EBM she obtains to baby.  Mother is going to pump at home after feedings to help encourage a good milk supply.  Answered all questions and provided reassurance.  Parents feel comfortable with a discharge today. ? ?Suggested mother contact the [facility] peer counselors for lactation assistance if needed.  She does not have private insurance.  Mother will follow through with this.  Parents have our OP phone number for any further concerns.  Family has a return pediatric follow up appointment tomorrow.  Pediatrician updated. ? ? ?Maternal Data ?  ? ?Feeding ?Mother's Current Feeding Choice: Breast Milk and Donor Milk ?Nipple Type: Nfant Standard Flow (white) ? ?LATCH Score ?  ? ?  ? ?  ? ?  ? ?  ? ?  ? ? ?Lactation Tools Discussed/Used ?  ? ?Interventions ?Interventions: Education ? ?Discharge ?Discharge Education: Engorgement and breast care ? ?Consult Status ?Consult Status: Complete ?Date: 04/05/22 ?Follow-up type: Call as needed ? ? ? ?Elwyn Klosinski R Jhair Witherington ?04/05/2022, 11:52 AM ? ? ? ?

## 2022-04-05 NOTE — Lactation Note (Signed)
This note was copied from a baby's chart. ?Lactation Consultation Note ? ?Patient Name: Girl Kelliann Pendergraph ?Today's Date: 04/05/2022 ?Reason for consult: Follow-up assessment;Term;1st time breastfeeding;Primapara ?Age:27 hours ? ? ?Lactation Follow Up Consult: ? ?Pediatrician requested a lactation follow up visit; mother desires to have a latch observation completed prior to discharge.  Baby "[name]" was asleep when I arrived.  Offered to return to assist and educate when she awakens.  Mother will call.  Will complete further education at this time. ? ? ?Maternal Data ?  ? ?Feeding ?Mother's Current Feeding Choice: Breast Milk and Donor Milk ? ?LATCH Score ?  ? ?  ? ?  ? ?  ? ?  ? ?  ? ? ?Lactation Tools Discussed/Used ?  ? ?Interventions ?  ? ?Discharge ?  ? ?Consult Status ?Consult Status: Follow-up ?Date: 04/05/22 ?Follow-up type: In-patient ? ? ? ?Jettie Lazare R Brinlyn Cena ?04/05/2022, 9:53 AM ? ? ? ?

## 2022-04-11 ENCOUNTER — Ambulatory Visit (INDEPENDENT_AMBULATORY_CARE_PROVIDER_SITE_OTHER): Payer: Self-pay

## 2022-04-11 VITALS — BP 122/77 | HR 99 | Wt 177.6 lb

## 2022-04-11 DIAGNOSIS — Z0130 Encounter for examination of blood pressure without abnormal findings: Secondary | ICD-10-CM

## 2022-04-11 NOTE — Progress Notes (Signed)
Subjective:  ?Renee Ferrell is a 26 y.o. female here for BP check.  ?Pt took Nifedipine 30 mg at noon today.  ? ?Hypertension ROS: taking medications as instructed, no medication side effects noted, no TIA's, no chest pain on exertion, no dyspnea on exertion, and no swelling of ankles.  ? ? ?Objective:  ?BP 122/77   Pulse 99   Wt 177 lb 9.6 oz (80.6 kg)   Breastfeeding Yes   BMI 31.46 kg/m?   ?Appearance alert, well appearing, and in no distress. ?General exam BP noted to be well controlled today in office.  ? ? ?Assessment:   ?Blood Pressure well controlled  ? ?Plan:  ?Keep upcoming routine postpartum appt .  ?

## 2022-04-13 NOTE — Progress Notes (Signed)
Patient was assessed and managed by nursing staff during this encounter. I have reviewed the chart and agree with the documentation and plan. I have also made any necessary editorial changes. ? ?Catalina Antigua, MD ?04/13/2022 6:42 AM  ? ?

## 2022-05-16 ENCOUNTER — Ambulatory Visit (INDEPENDENT_AMBULATORY_CARE_PROVIDER_SITE_OTHER): Payer: Self-pay | Admitting: Obstetrics and Gynecology

## 2022-05-16 ENCOUNTER — Telehealth: Payer: Self-pay | Admitting: *Deleted

## 2022-05-16 ENCOUNTER — Encounter: Payer: Self-pay | Admitting: Obstetrics and Gynecology

## 2022-05-16 MED ORDER — NORETHINDRONE 0.35 MG PO TABS: 1.0000 | ORAL_TABLET | Freq: Every day | ORAL | 11 refills | Status: DC

## 2022-05-16 NOTE — Progress Notes (Signed)
Post Partum Visit Note  Renee Ferrell is a 26 y.o. G61P1001 female who presents for a postpartum visit. She is 6 week postpartum following a normal spontaneous vaginal delivery.  I have fully reviewed the prenatal and intrapartum course. The delivery was at 14 gestational weeks.  Anesthesia: epidural. Postpartum course has been good. Baby is doing well. Baby is feeding by both breast and bottle- Enfamil Enspire . Bleeding no bleeding. Bowel function is normal. Bladder function is normal. Patient is not sexually active. Contraception method is OCP (estrogen/progesterone). Postpartum depression screening: negative.   The pregnancy intention screening data noted above was reviewed. Potential methods of contraception were discussed. The patient elected to proceed with No data recorded.   Edinburgh Postnatal Depression Scale - 05/16/22 1108       Edinburgh Postnatal Depression Scale:  In the Past 7 Days   I have been able to laugh and see the funny side of things. 0    I have looked forward with enjoyment to things. 0    I have blamed myself unnecessarily when things went wrong. 0    I have been anxious or worried for no good reason. 0    I have felt scared or panicky for no good reason. 0    Things have been getting on top of me. 0    I have been so unhappy that I have had difficulty sleeping. 0    I have felt sad or miserable. 0    I have been so unhappy that I have been crying. 0    The thought of harming myself has occurred to me. 0    Edinburgh Postnatal Depression Scale Total 0             Health Maintenance Due  Topic Date Due   HPV VACCINES (1 - 2-dose series) Never done   PAP-Cervical Cytology Screening  Never done   PAP SMEAR-Modifier  Never done   COVID-19 Vaccine (3 - Pfizer risk series) 12/28/2020       Review of Systems Pertinent items noted in HPI and remainder of comprehensive ROS otherwise negative.  Objective:  BP 119/78   Pulse 79   Ht 5\' 3"  (1.6 m)   Wt 170  lb (77.1 kg)   LMP 05/05/2022   BMI 30.11 kg/m    General:  alert, cooperative, and no distress   Breasts:  normal  Lungs: clear to auscultation bilaterally  Heart:  regular rate and rhythm  Abdomen: soft, non-tender; bowel sounds normal; no masses,  no organomegaly   Wound N/a  GU exam:  normal       Assessment:    There are no diagnoses linked to this encounter.  Normal postpartum exam.   Plan:   Essential components of care per ACOG recommendations:  1.  Mood and well being: Patient with negative depression screening today. Reviewed local resources for support.  - Patient tobacco use? No.   - hx of drug use? No.    2. Infant care and feeding:  -Patient currently breastmilk feeding? No.  -Social determinants of health (SDOH) reviewed in EPIC. No concerns  3. Sexuality, contraception and birth spacing - Patient does not want a pregnancy in the next year.  Desired family size is 2 children.  - Reviewed reproductive life planning. Reviewed contraceptive methods based on pt preferences and effectiveness.  Patient desired Oral Contraceptive today.  Rx Lyza provided - Discussed birth spacing of 18 months  4. Sleep and fatigue -  Encouraged family/partner/community support of 4 hrs of uninterrupted sleep to help with mood and fatigue  5. Physical Recovery  - Discussed patients delivery and complications. She describes her labor as good. - Patient had a Vaginal, no problems at delivery. Patient had a 1st degree laceration. Perineal healing reviewed. Patient expressed understanding - Patient has urinary incontinence? No. - Patient is safe to resume physical and sexual activity  6.  Health Maintenance - HM due items addressed Yes - Last pap smear No results found for: DIAGPAP Pap smear not done at today's visit.  Patient reports normal pap smear with PCP in July 2022 -Breast Cancer screening indicated? No.   7. Chronic Disease/Pregnancy Condition follow up: Normotensive  without any medication Patient my discontinue procardia.   - PCP follow up  Mora Bellman, [place] for [employer], [place]

## 2022-05-16 NOTE — Telephone Encounter (Signed)
Pt called to office stating Rx went to wrong pharmacy. Pt advised she may call the pharmacy she wants to use and have them transfer Rx.

## 2022-12-21 ENCOUNTER — Other Ambulatory Visit: Payer: Self-pay

## 2022-12-21 ENCOUNTER — Ambulatory Visit (HOSPITAL_COMMUNITY)
Admission: EM | Admit: 2022-12-21 | Discharge: 2022-12-21 | Disposition: A | Discharge status: Home / Self Care | Payer: Medicaid Other | Attending: Emergency Medicine | Admitting: Emergency Medicine

## 2022-12-21 ENCOUNTER — Encounter (HOSPITAL_COMMUNITY): Payer: Self-pay

## 2022-12-21 DIAGNOSIS — M545 Low back pain, unspecified: Secondary | ICD-10-CM | POA: Diagnosis not present

## 2022-12-21 MED ORDER — PREDNISONE 20 MG PO TABS: 40.0000 mg | ORAL_TABLET | Freq: Every day | ORAL | 0 refills | Status: AC

## 2022-12-21 MED ORDER — CYCLOBENZAPRINE HCL 10 MG PO TABS: 10.0000 mg | ORAL_TABLET | Freq: Every day | ORAL | 0 refills | Status: AC

## 2022-12-21 NOTE — Discharge Instructions (Addendum)
Your pain is most likely caused by irritation to the muscles or ligaments.   Starting tomorrow take prednisone every morning with food for 5 days, this reduces inflammation and irritation and ideally will lessen your pain May take Tylenol 500 to 1000 mg every 6 hours in addition to this throughout the day  You may use muscle relaxer at bedtime for additional comfort, be mindful of this will make you drowsy   You may use heating pad in 15 minute intervals as needed for additional comfort, you may find comfort in using ice in 10-15 minutes over affected area  Begin stretching affected area daily for 10 minutes as tolerated to further loosen muscles   When lying down place pillow underneath and between knees for support  Can try sleeping without pillow on firm mattress   Practice good posture: head back, shoulders back, chest forward, pelvis back and weight distributed evenly on both legs  If pain persist after recommended treatment or reoccurs if may be beneficial to follow up with orthopedic specialist for evaluation, this doctor specializes in the bones and can manage your symptoms long-term with options such as but not limited to imaging, medications or physical therapy

## 2022-12-21 NOTE — ED Triage Notes (Signed)
Pt presents with back pain x 3 days.   Denies urinary issues.

## 2022-12-21 NOTE — ED Provider Notes (Signed)
[facility]    CSN: [id number] Arrival date & time: 12/21/22  1756      History   Chief Complaint Chief Complaint  Patient presents with   Back Pain    HPI Renee Ferrell is a 26 y.o. female.   Patient presents for evaluation of lower back pain beginning 3 days ago without precipitating event, injury or trauma.  First noticed when changing positions from sitting to lying when getting out of a car.  Pain has been constant since, worsened with long periods of standing.  Pain is described as aching with sharp shooting pains into the central back.  Has attempted use of ibuprofen and Advil which has been ineffective.  Denies numbness, tingling, urinary or bowel incontinence.    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 Authorizing Provider  acetaminophen (TYLENOL) 325 MG tablet Take 2 tablets (650 mg total) by mouth every 4 (four) hours as needed (for pain scale < 4). Patient not taking: Reported on 04/11/2022 04/05/22   Warner Mccreedy, MD  furosemide (LASIX) 20 MG tablet Take 1 tablet (20 mg total) by mouth daily. Patient not taking: Reported on 04/11/2022 04/05/22   Warner Mccreedy, MD  ibuprofen (ADVIL) 600 MG tablet Take 1 tablet (600 mg total) by mouth every 6 (six) hours. Patient not taking: Reported on  05/16/2022 04/05/22   Warner Mccreedy, MD  NIFEdipine (ADALAT CC) 30 MG 24 hr tablet Take 1 tablet (30 mg total) by mouth daily. Patient not taking: Reported on 05/16/2022 04/05/22   Warner Mccreedy, MD  norethindrone (MICRONOR) 0.35 MG tablet Take 1 tablet (0.35 mg total) by mouth daily. 05/16/22   Constant, Peggy, MD  polyethylene glycol powder (GLYCOLAX/MIRALAX) 17 GM/SCOOP powder Take 17 g by mouth daily. Patient not taking: Reported on 04/11/2022 04/05/22   Warner Mccreedy, MD  Prenatal Vit-Fe Fumarate-FA (MULTIVITAMIN-PRENATAL) 27-0.8 MG TABS tablet Take 1 tablet by mouth daily at 12 noon.    [provider]    Family History Family History  Problem Relation Age of Onset   Hypertension Mother    Diabetes Paternal Grandmother    Hypertension Paternal Grandfather    Diabetes Paternal Grandfather     Social History Social History   Tobacco Use   Smoking status: Never   Smokeless tobacco: Never  Vaping Use   Vaping Use: Never used  Substance Use Topics   Alcohol use: Not Currently    Comment: socially   Drug use: No     Allergies  Patient has no known allergies.   Review of Systems Review of Systems  Constitutional: Negative.   Respiratory: Negative.    Cardiovascular: Negative.   Gastrointestinal: Negative.   Musculoskeletal:  Positive for back pain. Negative for arthralgias, gait problem, joint swelling, myalgias, neck pain and neck stiffness.  Skin: Negative.      Physical Exam Triage Vital Signs ED Triage Vitals  Enc Vitals Group     BP 12/21/22 1934 130/67     Pulse Rate 12/21/22 1934 69     Resp 12/21/22 1934 18     Temp 12/21/22 1934 97.9 F (36.6 C)     Temp Source 12/21/22 1934 Oral     SpO2 12/21/22 1934 100 %     Weight --      Height --      Head Circumference --      Peak Flow --      Pain Score 12/21/22 1933 7     Pain Loc --      Pain Edu? --      Excl. in GC? --    No data found.  Updated Vital Signs BP 130/67 (BP Location: Right Arm)    Pulse 69   Temp 97.9 F (36.6 C) (Oral)   Resp 18   LMP 12/03/2022 (Exact Date)   SpO2 100%   Visual Acuity Right Eye Distance:   Left Eye Distance:   Bilateral Distance:    Right Eye Near:   Left Eye Near:    Bilateral Near:     Physical Exam Constitutional:      Appearance: Normal appearance.  HENT:     Head: Normocephalic.  Eyes:     Extraocular Movements: Extraocular movements intact.  Pulmonary:     Effort: Pulmonary effort is normal.  Musculoskeletal:     Comments: Tenderness is present over the midline and right lower latissimus dorsi, no ecchymosis, swelling or deformity, able to sit erect without complication, able to twist turn and bend  Skin:    General: Skin is warm and dry.  Neurological:     Mental Status: She is alert and oriented to person, place, and time. Mental status is at baseline.      UC Treatments / Results  Labs (all labs ordered are listed, but only abnormal results are displayed) Labs Reviewed - No data to display  EKG   Radiology No results found.  Procedures Procedures (including critical care time)  Medications Ordered in UC Medications - No data to display  Initial Impression / Assessment and Plan / UC Course  I have reviewed the triage vital signs and the nursing notes.  Pertinent labs & imaging results that were available during my care of the patient were reviewed by me and considered in my medical decision making (see chart for details).  Acute midline low back pain without sciatica  Etiology is most likely muscular, could also be related to recent childbirth within the year, prescribed prednisone and Flexeril for outpatient use, declined injection in office, recommended RICE, heat, stretching, massage and activity as tolerated, may follow-up with urgent care or orthopedics if symptoms persist or worsen Final Clinical Impressions(s) / UC Diagnoses   Final diagnoses:  None   Discharge Instructions   None    ED  Prescriptions   None    PDMP not reviewed this encounter.   Valinda Hoar, [place] 12/21/22 2026

## 2023-02-06 ENCOUNTER — Ambulatory Visit: Payer: Medicaid Other | Admitting: Obstetrics and Gynecology

## 2023-03-07 ENCOUNTER — Ambulatory Visit (INDEPENDENT_AMBULATORY_CARE_PROVIDER_SITE_OTHER): Payer: Medicaid Other | Admitting: Obstetrics

## 2023-03-07 ENCOUNTER — Encounter: Payer: Self-pay | Admitting: Obstetrics

## 2023-03-07 ENCOUNTER — Other Ambulatory Visit (HOSPITAL_COMMUNITY)
Admission: RE | Admit: 2023-03-07 | Discharge: 2023-03-07 | Disposition: A | Discharge status: Home / Self Care | Payer: Medicaid Other | Source: Ambulatory Visit | Attending: Obstetrics and Gynecology | Admitting: Obstetrics and Gynecology

## 2023-03-07 VITALS — BP 119/81 | HR 80 | Ht 63.0 in | Wt 170.9 lb

## 2023-03-07 DIAGNOSIS — Z3041 Encounter for surveillance of contraceptive pills: Secondary | ICD-10-CM

## 2023-03-07 DIAGNOSIS — N898 Other specified noninflammatory disorders of vagina: Secondary | ICD-10-CM

## 2023-03-07 DIAGNOSIS — Z1339 Encounter for screening examination for other mental health and behavioral disorders: Secondary | ICD-10-CM

## 2023-03-07 DIAGNOSIS — Z3009 Encounter for other general counseling and advice on contraception: Secondary | ICD-10-CM

## 2023-03-07 DIAGNOSIS — Z01419 Encounter for gynecological examination (general) (routine) without abnormal findings: Secondary | ICD-10-CM | POA: Insufficient documentation

## 2023-03-07 MED ORDER — LO LOESTRIN FE 1 MG-10 MCG / 10 MCG PO TABS: 1.0000 | ORAL_TABLET | Freq: Every day | ORAL | 4 refills | Status: DC

## 2023-03-07 NOTE — Progress Notes (Signed)
Pt requests restart birth control pills.. Denies IC x 14 days She is no longer breastfeeding.  Pt also reports foul vaginal odor.

## 2023-03-07 NOTE — Progress Notes (Signed)
Subjective:        Renee Ferrell is a 27 y.o. female here for a routine exam.  Current complaints: Malodorous vaginal discharge..    Personal health questionnaire:  Is patient Ashkenazi Jewish, have a family history of breast and/or ovarian cancer: no Is there a family history of uterine cancer diagnosed at age < 56, gastrointestinal cancer, urinary tract cancer, family member who is a Field seismologist syndrome-associated carrier: no Is the patient overweight and hypertensive, family history of diabetes, personal history of gestational diabetes, preeclampsia or PCOS: no Is patient over 49, have PCOS,  family history of premature CHD under age 57, diabetes, smoke, have hypertension or peripheral artery disease:  no At any time, has a partner hit, kicked or otherwise hurt or frightened you?: no Over the past 2 weeks, have you felt down, depressed or hopeless?: no Over the past 2 weeks, have you felt little interest or pleasure in doing things?:no   Gynecologic History Patient's last menstrual period was 02/28/2023. Contraception: OCP (estrogen/progesterone) Last Pap: 2022. Results were: normal Last mammogram: n/a. 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 (Patient  not taking: Reported on 04/11/2022), Disp: 60 tablet, Rfl: 0   cyclobenzaprine (FLEXERIL) 10 MG tablet, Take 1 tablet (10 mg total) by mouth at bedtime. (Patient not taking: Reported on 03/07/2023), Disp: 10 tablet, Rfl: 0   furosemide (LASIX) 20 MG tablet, Take 1 tablet (20 mg total) by mouth daily. (Patient not taking: Reported on 04/11/2022), Disp: 4 tablet, Rfl: 0   ibuprofen (ADVIL) 600 MG tablet, Take 1 tablet (600 mg total) by mouth every 6 (six) hours. (Patient not taking: Reported on 05/16/2022), Disp: 30 tablet, Rfl: 0   NIFEdipine (ADALAT CC) 30 MG 24 hr tablet, Take 1 tablet (30 mg total) by mouth daily. (Patient not taking: Reported on 05/16/2022), Disp: 60 tablet, Rfl: 0   polyethylene glycol powder (GLYCOLAX/MIRALAX) 17 GM/SCOOP powder, Take 17 g by mouth daily. (Patient not taking: Reported on 04/11/2022), Disp: 238 g, Rfl: 0   predniSONE (DELTASONE) 20 MG tablet, Take 2 tablets (40 mg total) by mouth daily. (Patient not taking: Reported on 03/07/2023), Disp: 10 tablet, Rfl: 0   Prenatal Vit-Fe Fumarate-FA (MULTIVITAMIN-PRENATAL) 27-0.8 MG TABS tablet, Take 1 tablet by mouth daily at 12 noon. (Patient not taking: Reported on 03/07/2023), Disp: , Rfl:  No Known Allergies  Social History   Tobacco Use   Smoking status: Never    Passive exposure: Never   Smokeless tobacco: Never  Substance Use  Topics   Alcohol use: Not Currently    Comment: socially    Family History  Problem Relation Age of Onset   Hypertension Mother    Diabetes Paternal Grandmother    Hypertension Paternal Grandfather    Diabetes Paternal Grandfather       Review of Systems  Constitutional: negative for fatigue and weight loss Respiratory: negative for cough and wheezing Cardiovascular: negative for chest pain, fatigue and palpitations Gastrointestinal: negative for abdominal pain and change in bowel habits Musculoskeletal:negative for myalgias Neurological: negative for gait problems and  tremors Behavioral/Psych: negative for abusive relationship, depression Endocrine: negative for temperature intolerance    Genitourinary: positive for malodorous vaginal discharge.  negative for abnormal menstrual periods, genital lesions, hot flashes, sexual problems  Integument/breast: negative for breast lump, breast tenderness, nipple discharge and skin lesion(s)    Objective:       BP 119/81   Pulse 80   Ht '5\' 3"'$  (1.6 m)   Wt 170 lb 14.4 oz (77.5 kg)   LMP 02/28/2023   Breastfeeding No   BMI 30.27 kg/m  General:   Alert and no distress  Skin:   no rash or abnormalities  Lungs:   clear to auscultation bilaterally  Heart:   regular rate and rhythm, S1, S2 normal, no murmur, click, rub or gallop  Breasts:   normal without suspicious masses, skin or nipple changes or axillary nodes  Abdomen:  normal findings: no organomegaly, soft, non-tender and no hernia  Pelvis:  External genitalia: normal general appearance Urinary system: urethral meatus normal and bladder without fullness, nontender Vaginal: normal without tenderness, induration or masses Cervix: normal appearance Adnexa: normal bimanual exam Uterus: anteverted and non-tender, normal size   Lab Review Urine pregnancy test Labs reviewed yes Radiologic studies reviewed no  I have spent a total of 25 minutes of face-to-face time, excluding clinical staff time, reviewing notes and preparing to see patient, ordering tests and/or medications, and counseling the patient.   Assessment:    1. Encounter for gynecological examination with Papanicolaou smear of cervix Rx: - Cytology - PAP( [facility]) Rx 2. Vaginal discharge Rx: - Cervicovaginal ancillary only( [facility])  3. Encounter for other general counseling and advice on contraception - options discussed - wants OCP's  4. Encounter for surveillance of contraceptive pills Rx: - LO LOESTRIN FE 1 MG-10 MCG / 10 MCG tablet; Take 1 tablet by mouth daily.   Dispense: 84 tablet; Refill: 4     Plan:    Education reviewed: calcium supplements, depression evaluation, low fat, low cholesterol diet, safe sex/STD prevention, self breast exams, and weight bearing exercise. Contraception: OCP (estrogen/progesterone). Follow up in: 1 year.   Meds ordered this encounter  Medications   LO LOESTRIN FE 1 MG-10 MCG / 10 MCG tablet    Sig: Take 1 tablet by mouth daily.    Dispense:  84 tablet    Refill:  4    Submit other coverage code 3  BIN:  [id number]  PCN:  [id number]   GRP:  [id number]   ID:  [id number]   No orders of the defined types were placed in this encounter.  Shelly Bombard, MD 03/07/2023 10:48 AM

## 2023-03-08 LAB — CERVICOVAGINAL ANCILLARY ONLY
Bacterial Vaginitis (gardnerella): POSITIVE — AB
Candida Glabrata: NEGATIVE
Candida Vaginitis: NEGATIVE
Chlamydia: NEGATIVE
Comment: NEGATIVE
Comment: NEGATIVE
Comment: NEGATIVE
Comment: NEGATIVE
Comment: NEGATIVE
Comment: NORMAL
Neisseria Gonorrhea: NEGATIVE
Trichomonas: NEGATIVE

## 2023-03-08 LAB — CYTOLOGY - PAP
Chlamydia: NEGATIVE
Comment: NEGATIVE
Comment: NORMAL
Diagnosis: NEGATIVE
Neisseria Gonorrhea: NEGATIVE

## 2023-03-09 ENCOUNTER — Other Ambulatory Visit: Payer: Self-pay | Admitting: Obstetrics

## 2023-03-09 ENCOUNTER — Telehealth: Payer: Self-pay | Admitting: Emergency Medicine

## 2023-03-09 DIAGNOSIS — N760 Acute vaginitis: Secondary | ICD-10-CM

## 2023-03-09 MED ORDER — METRONIDAZOLE 500 MG PO TABS: 500.0000 mg | ORAL_TABLET | Freq: Two times a day (BID) | ORAL | 2 refills | Status: AC

## 2023-03-09 NOTE — Telephone Encounter (Signed)
-----   Message from Shelly Bombard, MD sent at 03/09/2023  8:45 AM EDT ----- Flagyl Rx for BV

## 2023-03-09 NOTE — Telephone Encounter (Signed)
Pt informed of results, Rx.   

## 2023-07-18 ENCOUNTER — Telehealth: Payer: Self-pay

## 2023-07-18 NOTE — Telephone Encounter (Signed)
Pt c/o vaginal itching. Denies discharge. Pt states feels similar to a yeast infection. Advised pt she can try monistat and see if symptoms resolve. Self swab scheduled. Pt advised to call and cancel self swab appt if better.

## 2023-07-25 ENCOUNTER — Ambulatory Visit: Payer: Medicaid Other

## 2024-01-04 ENCOUNTER — Encounter: Payer: Self-pay | Admitting: Obstetrics and Gynecology

## 2024-01-04 ENCOUNTER — Other Ambulatory Visit (HOSPITAL_COMMUNITY)
Admission: RE | Admit: 2024-01-04 | Discharge: 2024-01-04 | Disposition: A | Discharge status: Home / Self Care | Payer: Medicaid Other | Source: Ambulatory Visit | Attending: Obstetrics and Gynecology | Admitting: Obstetrics and Gynecology

## 2024-01-04 ENCOUNTER — Ambulatory Visit: Payer: Medicaid Other | Admitting: Obstetrics and Gynecology

## 2024-01-04 VITALS — BP 128/91 | Wt 176.4 lb

## 2024-01-04 DIAGNOSIS — Z01419 Encounter for gynecological examination (general) (routine) without abnormal findings: Secondary | ICD-10-CM | POA: Diagnosis not present

## 2024-01-04 DIAGNOSIS — Z113 Encounter for screening for infections with a predominantly sexual mode of transmission: Secondary | ICD-10-CM | POA: Diagnosis not present

## 2024-01-04 DIAGNOSIS — N921 Excessive and frequent menstruation with irregular cycle: Secondary | ICD-10-CM

## 2024-01-04 MED ORDER — NORETHIN ACE-ETH ESTRAD-FE 1.5-30 MG-MCG PO TABS: 1.0000 | ORAL_TABLET | Freq: Every day | ORAL | 11 refills | Status: DC

## 2024-01-04 NOTE — Progress Notes (Signed)
 Pt. Presents for annual. Pt. Complains of spotting between periods and increase in yeast infections since giving birth. Would like to discuss switching birth control.

## 2024-01-04 NOTE — Progress Notes (Signed)
 GYNECOLOGY ANNUAL PREVENTATIVE CARE ENCOUNTER NOTE  History:     Renee Ferrell is a 28 y.o. [id number] female here for a routine annual gynecologic exam.  Current complaints: irregular bleeding with current OCP.   Denies discharge, pelvic pain, problems with intercourse or other gynecologic concerns.   Pt notes 5-6 month hx of breakthrough bleeding on lo-loestrin .  Pt notes bleeding the week before placebo pills followed by lighter bleeding during the week of placebo.   Gynecologic History Patient's last menstrual period was 12/18/2023 (approximate). Contraception: OCP (estrogen/progesterone) Last Pap: 03/07/23. 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 (Patient not taking: Reported on 01/04/2024) 60 tablet 0   cyclobenzaprine  (FLEXERIL ) 10 MG tablet Take 1 tablet (10 mg total) by mouth at bedtime. (Patient not taking: Reported on 01/04/2024) 10 tablet 0   furosemide  (LASIX ) 20 MG tablet Take 1 tablet (20 mg total) by mouth daily. (Patient not taking: Reported on 01/04/2024) 4 tablet 0   ibuprofen  (ADVIL ) 600 MG tablet Take 1 tablet (600 mg total) by mouth every 6 (six) hours. (Patient not taking: Reported on 01/04/2024) 30 tablet 0   metroNIDAZOLE  (FLAGYL ) 500 MG tablet Take 1 tablet  (500 mg total) by mouth 2 (two) times daily. (Patient not taking: Reported on 01/04/2024) 14 tablet 2   NIFEdipine  (ADALAT  CC) 30 MG 24 hr tablet Take 1 tablet (30 mg total) by mouth daily. (Patient not taking: Reported on 01/04/2024) 60 tablet 0   polyethylene glycol powder (GLYCOLAX /MIRALAX ) 17 GM/SCOOP powder Take 17 g by mouth daily. (Patient not taking: Reported on 01/04/2024) 238 g 0   predniSONE  (DELTASONE ) 20 MG tablet Take 2 tablets (40 mg total) by mouth daily. (Patient not taking: Reported on 01/04/2024) 10 tablet 0   No current facility-administered medications on file prior to visit.    No Known Allergies  Social History:  reports that she has never smoked. She has never been exposed to tobacco smoke. She has never used smokeless tobacco. She reports that she does not currently use alcohol. She reports that she does not use drugs.  Family History  Problem Relation Age of Onset   Hypertension Mother    Diabetes Paternal Grandmother    Hypertension Paternal Grandfather    Diabetes Paternal Grandfather     The following portions of the patient's history were reviewed and updated as appropriate: allergies, current medications, past family history, past medical history, past social history, past surgical history and problem list.  Review of Systems Pertinent items noted in HPI and remainder of comprehensive ROS otherwise negative.  Physical Exam:  BP ROLLEN)  128/91 (BP Location: Left Arm, Cuff Size: Normal)   Wt 176 lb 6.4 oz (80 kg)   LMP 12/18/2023 (Approximate)   BMI 31.25 kg/m  CONSTITUTIONAL: Well-developed, well-nourished female in no acute distress.  HENT:  Normocephalic, atraumatic, External right and left ear normal. Oropharynx is clear and moist EYES: Conjunctivae and EOM are normal.  NECK: Normal range of motion, supple, no masses.  Normal thyroid .  SKIN: Skin is warm and dry. No rash noted. Not diaphoretic. No erythema. No pallor. MUSCULOSKELETAL: Normal range of motion.  No tenderness.  No cyanosis, clubbing, or edema.  2+ distal pulses. NEUROLOGIC: Alert and oriented to person, place, and time. Normal reflexes, muscle tone coordination.  PSYCHIATRIC: Normal mood and affect. Normal behavior. Normal judgment and thought content. CARDIOVASCULAR: Normal heart rate noted, regular rhythm RESPIRATORY: Clear to auscultation bilaterally. Effort and breath sounds normal, no problems with respiration noted. BREASTS: deferred ABDOMEN: Soft, no distention noted.  No tenderness, rebound or guarding.  PELVIC: Normal appearing external genitalia and urethral meatus; normal appearing vaginal mucosa and cervix.  No abnormal discharge noted. Vaginal swab taken.  Normal uterine size, no other palpable masses, no uterine or adnexal tenderness.  Performed in the presence of a chaperone.   Assessment and Plan:    1. Women's annual routine gynecological examination (Primary) Normal annual exam  2. Routine screening for STI (sexually transmitted infection) Per pt request - HIV antibody (with reflex) - RPR - Hepatitis B surface antigen - Hepatitis C antibody - Cervicovaginal ancillary only( [place])  3. Breakthrough bleeding on birth control pills Pt had a very low estrogen pill and bleeding would occur prior to placebo.  Pt denies missed pills.  Pt advised to finish current pack and then start loestrin  1.5/30 for 3 month trial.  Hopefully bleeding improves with higher estrogen content.  - norethindrone -ethinyl estradiol-iron (LOESTRIN  FE) 1.5-30 MG-MCG tablet; Take 1 tablet by mouth daily.  Dispense: 28 tablet; Refill: 11  Routine preventative health maintenance measures emphasized. Please refer to After Visit Summary for other counseling recommendations.    F/u in 4 months with virtual visit to discuss breakthrough bleeding.  Jerilynn Buddle, MD, FACOG Obstetrician & Gynecologist, [facility], [facility]

## 2024-01-06 LAB — HIV ANTIBODY (ROUTINE TESTING W REFLEX): HIV Screen 4th Generation wRfx: NONREACTIVE

## 2024-01-06 LAB — HEPATITIS C ANTIBODY: Hep C Virus Ab: NONREACTIVE

## 2024-01-06 LAB — HEPATITIS B SURFACE ANTIGEN: Hepatitis B Surface Ag: NEGATIVE

## 2024-01-06 LAB — RPR: RPR Ser Ql: NONREACTIVE

## 2024-01-08 LAB — CERVICOVAGINAL ANCILLARY ONLY
Bacterial Vaginitis (gardnerella): NEGATIVE
Candida Glabrata: NEGATIVE
Candida Vaginitis: POSITIVE — AB
Chlamydia: NEGATIVE
Comment: NEGATIVE
Comment: NEGATIVE
Comment: NEGATIVE
Comment: NEGATIVE
Comment: NEGATIVE
Comment: NORMAL
Neisseria Gonorrhea: NEGATIVE
Trichomonas: NEGATIVE

## 2024-01-09 ENCOUNTER — Other Ambulatory Visit: Payer: Self-pay

## 2024-01-09 MED ORDER — FLUCONAZOLE 150 MG PO TABS
150.0000 mg | ORAL_TABLET | Freq: Once | ORAL | 0 refills | Status: AC
Start: 2024-01-09 — End: 2024-01-09

## 2024-01-09 NOTE — Progress Notes (Signed)
 RX sent for yeast infection. My chart message sent.

## 2024-01-22 ENCOUNTER — Ambulatory Visit (HOSPITAL_BASED_OUTPATIENT_CLINIC_OR_DEPARTMENT_OTHER)
Admission: RE | Admit: 2024-01-22 | Discharge: 2024-01-22 | Disposition: A | Discharge status: Home / Self Care | Payer: Medicaid Other | Source: Ambulatory Visit | Attending: Urgent Care | Admitting: Urgent Care

## 2024-01-22 ENCOUNTER — Ambulatory Visit
Admission: RE | Admit: 2024-01-22 | Discharge: 2024-01-22 | Disposition: A | Discharge status: Home / Self Care | Payer: Medicaid Other | Source: Ambulatory Visit | Attending: Family Medicine | Admitting: Family Medicine

## 2024-01-22 VITALS — BP 143/85 | HR 129 | Temp 99.2°F | Resp 20

## 2024-01-22 DIAGNOSIS — J209 Acute bronchitis, unspecified: Secondary | ICD-10-CM | POA: Diagnosis present

## 2024-01-22 LAB — POC COVID19/FLU A&B COMBO
Covid Antigen, POC: NEGATIVE
Influenza A Antigen, POC: NEGATIVE
Influenza B Antigen, POC: NEGATIVE

## 2024-01-22 MED ORDER — PROMETHAZINE-DM 6.25-15 MG/5ML PO SYRP: 5.0000 mL | ORAL_SOLUTION | Freq: Three times a day (TID) | ORAL | 0 refills | Status: AC | PRN

## 2024-01-22 MED ORDER — IBUPROFEN 600 MG PO TABS: 600.0000 mg | ORAL_TABLET | Freq: Four times a day (QID) | ORAL | 0 refills | Status: AC | PRN

## 2024-01-22 MED ORDER — ACETAMINOPHEN 325 MG PO TABS: 650.0000 mg | ORAL_TABLET | Freq: Four times a day (QID) | ORAL | 0 refills | Status: AC | PRN

## 2024-01-22 MED ORDER — CETIRIZINE HCL 10 MG PO TABS: 10.0000 mg | ORAL_TABLET | Freq: Every day | ORAL | 0 refills | Status: AC

## 2024-01-22 NOTE — Discharge Instructions (Signed)
I have placed orders to have an x-ray done at the med center in [facility].  Please had there now.  Go through the main hospital and not the emergency room.  Once you are there and let them know that you will came to our clinic and we send she to their facility for an outpatient x-ray.  If no one is at the front desk then they are likely out the rest of the day and at that point you would have to go through the emergency room.  Do not check in as a patient through the emergency room.  Simply let them know that you are there for an outpatient x-ray from our clinic.  I will call you with your results and update our treatment plan if necessary after I get the report.  Please wait to go pick up your prescriptions for any medications I have prescribed for you until after we discussed your x-ray results.  We will manage this as a viral illness. For sore throat or cough try using a honey-based tea. Use 3 teaspoons of honey with juice squeezed from half lemon. Place shaved pieces of ginger into 1/2-1 cup of water and warm over stove top. Then mix the ingredients and repeat every 4 hours as needed. Please take ibuprofen 600mg  every 6 hours with food alternating with OR taken together with Tylenol 500mg -650mg  every 6 hours for throat pain, fevers, aches and pains. Hydrate very well with at least 2 liters of water. Eat light meals such as soups (chicken and noodles, vegetable, chicken and wild rice).  Do not eat foods that you are allergic to.  Taking an antihistamine like Zyrtec (10mg  daily) can help against postnasal drainage, sinus congestion which can cause sinus pain, sinus headaches, throat pain, painful swallowing, coughing.  You can take this together with cough syrup.

## 2024-01-22 NOTE — ED Triage Notes (Signed)
Pt c/o cough, chest congestion, back pain and chest pain x 3 days-denies known fever-NAD-steady gait

## 2024-01-22 NOTE — ED Provider Notes (Signed)
[facility] - URG[facility]  Note:  This document was prepared using Conservation officer, historic buildings and may include unintentional dictation errors.  MRN: [id number] DOB: 12-May-1996  Subjective:   Renee Ferrell is a 28 y.o. female presenting for 3-day history of acute onset body pains, joint pains, coughing, chest congestion, lateral chest pain and back pain with her coughing and breathing.  No history of asthma.  Has documented history of renal disorder about all of her creatinine levels have been completely normal. No smoking of any kind including cigarettes, cigars, vaping, marijuana use.  Patient did go traveling recently and had multiple sick contacts some of which tested positive for flu.  No current facility-administered medications for this encounter.  Current Outpatient Medications:    acetaminophen (TYLENOL) 325 MG tablet, Take 2 tablets (650 mg total) by mouth every 4 (four) hours as needed (for pain scale < 4). (Patient not taking: Reported on 01/04/2024), Disp: 60 tablet, Rfl: 0   cyclobenzaprine (FLEXERIL) 10 MG tablet, Take 1 tablet (10 mg total) by mouth at bedtime. (Patient not taking: Reported on 01/04/2024), Disp: 10 tablet, Rfl: 0   furosemide (LASIX) 20 MG tablet, Take 1 tablet (20 mg total) by mouth daily. (Patient not taking: Reported on 01/04/2024), Disp: 4 tablet, Rfl: 0   ibuprofen (ADVIL) 600 MG tablet, Take 1 tablet (600 mg total) by mouth every 6 (six) hours. (Patient not taking: Reported on 01/04/2024), Disp: 30 tablet, Rfl: 0   metroNIDAZOLE (FLAGYL) 500 MG tablet, Take 1 tablet (500 mg total) by mouth 2 (two) times daily. (Patient not taking: Reported on 01/04/2024), Disp: 14 tablet, Rfl: 2   NIFEdipine (ADALAT CC) 30 MG 24 hr tablet, Take 1 tablet (30 mg total) by mouth daily. (Patient not taking: Reported on 01/04/2024), Disp: 60 tablet, Rfl: 0   norethindrone-ethinyl estradiol-iron (LOESTRIN FE) 1.5-30 MG-MCG tablet, Take 1 tablet by mouth daily., Disp: 28 tablet,  Rfl: 11   polyethylene glycol powder (GLYCOLAX/MIRALAX) 17 GM/SCOOP powder, Take 17 g by mouth daily. (Patient not taking: Reported on 01/04/2024), Disp: 238 g, Rfl: 0   predniSONE (DELTASONE) 20 MG tablet, Take 2 tablets (40 mg total) by mouth daily. (Patient not taking: Reported on 01/04/2024), Disp: 10 tablet, Rfl: 0   No Known Allergies  Past Medical History:  Diagnosis Date   Renal disorder    hydronephrosis L     Past Surgical History:  Procedure Laterality Date   KIDNEY SURGERY Left 2013   with stent placement and removal    Family History  Problem Relation Age of Onset   Hypertension Mother    Diabetes Paternal Grandmother    Hypertension Paternal Grandfather    Diabetes Paternal Grandfather     Social History   Tobacco Use   Smoking status: Never    Passive exposure: Never   Smokeless tobacco: Never  Vaping Use   Vaping status: Never Used  Substance Use Topics   Alcohol use: Yes    Comment: socially   Drug use: No    Review of Systems  All other systems reviewed and are negative.    Objective:   Vitals: BP (!) 143/85 (BP Location: Right Arm)   Pulse (!) 129   Temp 99.2 F (37.3 C) (Oral)   Resp 20   LMP 01/12/2024 (Approximate)   SpO2 94%   Physical Exam Constitutional:      General: She is not in acute distress.    Appearance: Normal appearance. She is well-developed and normal weight.  She is not ill-appearing, toxic-appearing or diaphoretic.  HENT:     Head: Normocephalic and atraumatic.     Right Ear: Tympanic membrane, ear canal and external ear normal. No drainage or tenderness. No middle ear effusion. There is no impacted cerumen. Tympanic membrane is not erythematous or bulging.     Left Ear: Tympanic membrane, ear canal and external ear normal. No drainage or tenderness.  No middle ear effusion. There is no impacted cerumen. Tympanic membrane is not erythematous or bulging.     Nose: Nose normal. No congestion or rhinorrhea.      Mouth/Throat:     Mouth: Mucous membranes are moist. No oral lesions.     Pharynx: No pharyngeal swelling, oropharyngeal exudate, posterior oropharyngeal erythema or uvula swelling.     Tonsils: No tonsillar exudate or tonsillar abscesses.  Eyes:     General: No scleral icterus.       Right eye: No discharge.        Left eye: No discharge.     Extraocular Movements: Extraocular movements intact.     Right eye: Normal extraocular motion.     Left eye: Normal extraocular motion.     Conjunctiva/sclera: Conjunctivae normal.  Cardiovascular:     Rate and Rhythm: Normal rate and regular rhythm.     Heart sounds: Normal heart sounds. No murmur heard.    No friction rub. No gallop.  Pulmonary:     Effort: Pulmonary effort is normal. No respiratory distress.     Breath sounds: No stridor. No wheezing, rhonchi or rales.  Chest:     Chest wall: No tenderness.  Musculoskeletal:     Cervical back: Normal range of motion and neck supple.  Lymphadenopathy:     Cervical: No cervical adenopathy.  Skin:    General: Skin is warm and dry.  Neurological:     General: No focal deficit present.     Mental Status: She is alert and oriented to person, place, and time.  Psychiatric:        Mood and Affect: Mood normal.        Behavior: Behavior normal.     Results for orders placed or performed during the hospital encounter of 01/22/24 (from the past 24 hours)  POC Covid19/Flu A&B Antigen     Status: None   Collection Time: 01/22/24  9:53 AM  Result Value Ref Range   Influenza A Antigen, POC Negative Negative   Influenza B Antigen, POC Negative Negative   Covid Antigen, POC Negative Negative    Assessment and Plan :   PDMP not reviewed this encounter.  1. Acute bronchitis, unspecified organism     Will pursue outpatient x-ray as I do not have a radiology technologist onsite.  Suspect viral URI, viral syndrome.  Advised supportive care, offered symptomatic relief. Counseled patient on  potential for adverse effects with medications prescribed/recommended today, ER and return-to-clinic precautions discussed, patient verbalized understanding.     Wallis Bamberg, [place] 01/22/24 [phone]

## 2025-01-24 ENCOUNTER — Telehealth: Payer: Self-pay

## 2025-01-24 ENCOUNTER — Other Ambulatory Visit: Payer: Self-pay | Admitting: Obstetrics and Gynecology

## 2025-01-24 DIAGNOSIS — N921 Excessive and frequent menstruation with irregular cycle: Secondary | ICD-10-CM

## 2025-01-24 MED ORDER — NORETHIN ACE-ETH ESTRAD-FE 1.5-30 MG-MCG PO TABS: 1.0000 | ORAL_TABLET | Freq: Every day | ORAL | 0 refills | Status: AC

## 2025-01-24 NOTE — Telephone Encounter (Signed)
 Returned call, pt wanted BC refill before appt on 2/17, sent per protocol

## 2025-02-10 ENCOUNTER — Ambulatory Visit: Payer: Self-pay | Admitting: Obstetrics
